# Patient Record
Sex: Female | Born: 1970 | Race: White | Hispanic: No | Marital: Single | Smoking: Never smoker
Health system: Southern US, Community
[De-identification: ages and names within clinical notes are randomized; demographics above are authoritative.]

## PROBLEM LIST (undated history)

## (undated) DIAGNOSIS — IMO0002 Reserved for concepts with insufficient information to code with codable children: Secondary | ICD-10-CM

## (undated) DIAGNOSIS — D169 Benign neoplasm of bone and articular cartilage, unspecified: Secondary | ICD-10-CM

## (undated) DIAGNOSIS — S83519A Sprain of anterior cruciate ligament of unspecified knee, initial encounter: Secondary | ICD-10-CM

## (undated) DIAGNOSIS — N946 Dysmenorrhea, unspecified: Secondary | ICD-10-CM

## (undated) HISTORY — PX: CYST EXCISION: SHX5701

## (undated) HISTORY — PX: KNEE CARTILAGE SURGERY: SHX688

## (undated) HISTORY — DX: Sprain of anterior cruciate ligament of unspecified knee, initial encounter: S83.519A

## (undated) HISTORY — DX: Reserved for concepts with insufficient information to code with codable children: IMO0002

## (undated) HISTORY — DX: Dysmenorrhea, unspecified: N94.6

---

## 2000-05-24 HISTORY — PX: BREAST REDUCTION SURGERY: SHX8

## 2001-05-24 HISTORY — PX: TUBAL LIGATION: SHX77

## 2008-10-15 ENCOUNTER — Encounter: Admission: RE | Admit: 2008-10-15 | Discharge: 2008-10-15 | Payer: Self-pay | Admitting: Obstetrics and Gynecology

## 2009-05-24 HISTORY — PX: ABDOMINAL HYSTERECTOMY: SHX81

## 2010-05-06 ENCOUNTER — Ambulatory Visit (HOSPITAL_COMMUNITY)
Admission: RE | Admit: 2010-05-06 | Discharge: 2010-05-07 | Payer: Self-pay | Source: Home / Self Care | Attending: Obstetrics and Gynecology | Admitting: Obstetrics and Gynecology

## 2010-05-06 ENCOUNTER — Encounter (INDEPENDENT_AMBULATORY_CARE_PROVIDER_SITE_OTHER): Payer: Self-pay | Admitting: Obstetrics and Gynecology

## 2010-06-14 ENCOUNTER — Encounter: Payer: Self-pay | Admitting: Surgery

## 2010-08-03 LAB — CBC
Hemoglobin: 11.2 g/dL — ABNORMAL LOW (ref 12.0–15.0)
MCHC: 32.8 g/dL (ref 30.0–36.0)
Platelets: 242 10*3/uL (ref 150–400)
RBC: 3.67 MIL/uL — ABNORMAL LOW (ref 3.87–5.11)
RDW: 13.5 % (ref 11.5–15.5)

## 2010-08-04 LAB — CBC
MCHC: 33.7 g/dL (ref 30.0–36.0)
Platelets: 235 10*3/uL (ref 150–400)
RDW: 13.4 % (ref 11.5–15.5)

## 2011-03-31 ENCOUNTER — Encounter (INDEPENDENT_AMBULATORY_CARE_PROVIDER_SITE_OTHER): Payer: Self-pay | Admitting: General Surgery

## 2011-04-02 ENCOUNTER — Encounter (INDEPENDENT_AMBULATORY_CARE_PROVIDER_SITE_OTHER): Payer: Self-pay | Admitting: Surgery

## 2011-04-02 ENCOUNTER — Ambulatory Visit (INDEPENDENT_AMBULATORY_CARE_PROVIDER_SITE_OTHER): Payer: 59 | Admitting: Surgery

## 2011-04-02 DIAGNOSIS — R220 Localized swelling, mass and lump, head: Secondary | ICD-10-CM

## 2011-04-02 NOTE — Progress Notes (Signed)
Subjective:     Patient ID: Madison Brady, female   DOB: Oct 15, 1970, 40 y.o.   MRN: [id number]  HPI The patient returns today. I saw her last year for a mass over her right  forehead. I recommended a CT scan of the head to evaluate this mass since it felt like bone and not like a sebaceous cyst. It is located over her right 4 head. It is getting larger. It is causing mild discomfort.   Review of Systems  Constitutional: Negative.   HENT: Negative.   Eyes: Negative.   Respiratory: Negative.   Cardiovascular: Negative.        Objective:   Physical Exam  Constitutional: She appears well-developed and well-nourished.  HENT:  Head: Atraumatic.  Nose: Nose normal.       3 cm mass fixed over right forehead. Minimally tender  Skin: Skin is warm and dry.  Psychiatric: She has a normal mood and affect. Her behavior is normal. Judgment and thought content normal.       Assessment:     Mass right forehead 3 cm enlarging.    Plan:     The patient needs a CT scan of her head to evaluate this mass since it feels fixed to her cranium and is not a sebaceous cyst. It is getting larger. Followup after CT done

## 2011-04-02 NOTE — Patient Instructions (Signed)
You will be scheduled for a CT of the head.

## 2011-04-08 ENCOUNTER — Ambulatory Visit
Admission: RE | Admit: 2011-04-08 | Discharge: 2011-04-08 | Disposition: A | Discharge status: Home / Self Care | Payer: 59 | Source: Ambulatory Visit | Attending: Surgery | Admitting: Surgery

## 2011-06-08 ENCOUNTER — Telehealth (INDEPENDENT_AMBULATORY_CARE_PROVIDER_SITE_OTHER): Payer: Self-pay | Admitting: Surgery

## 2011-06-11 ENCOUNTER — Telehealth (INDEPENDENT_AMBULATORY_CARE_PROVIDER_SITE_OTHER): Payer: Self-pay | Admitting: General Surgery

## 2011-06-11 NOTE — Telephone Encounter (Signed)
PT LEFT MESSAGE WITH FRONT DESK PERSONNEL RE RESULTS OF CT SCAN/ I REVIEWED CT RESULTS WITH PATIENT PER OK WITH DR. T. CORNETT/ DR. Luisa Hart ADVISED TO FAX COPY OF REPORT TO PT PRIMARY CARE DR./ MS Mccaughey INDICATED HER DR. IS DR. FULP WITH [facility]/ I WILL FAX COPY TO HER OFFICE/GY

## 2011-06-11 NOTE — Telephone Encounter (Signed)
PT NOTIFIED AND ADVISED OF CT RESULTS/GY

## 2011-07-13 ENCOUNTER — Other Ambulatory Visit: Payer: Self-pay | Admitting: Obstetrics and Gynecology

## 2011-07-13 DIAGNOSIS — N6320 Unspecified lump in the left breast, unspecified quadrant: Secondary | ICD-10-CM

## 2011-07-21 ENCOUNTER — Other Ambulatory Visit: Payer: 59

## 2011-07-28 ENCOUNTER — Other Ambulatory Visit: Payer: 59

## 2011-07-30 ENCOUNTER — Ambulatory Visit
Admission: RE | Admit: 2011-07-30 | Discharge: 2011-07-30 | Disposition: A | Discharge status: Home / Self Care | Payer: 59 | Source: Ambulatory Visit | Attending: Obstetrics and Gynecology | Admitting: Obstetrics and Gynecology

## 2011-07-30 DIAGNOSIS — N6320 Unspecified lump in the left breast, unspecified quadrant: Secondary | ICD-10-CM

## 2012-09-25 ENCOUNTER — Other Ambulatory Visit: Payer: Self-pay

## 2012-09-25 DIAGNOSIS — Z1231 Encounter for screening mammogram for malignant neoplasm of breast: Secondary | ICD-10-CM

## 2012-10-28 IMAGING — CT CT HEAD W/O CM
2 series · 16 of 30 positions shown, 18 images · non-contrast
Comparison: None.

CLINICAL DATA: Enlarging mass right frontal bone

CT HEAD WITHOUT CONTRAST
TECHNIQUE: Contiguous axial images were obtained from the base of
the skull through the vertex without contrast.

[Series 3: head bone · axial · 0.49mm/px · z∈[+53,+175]mm · 8 of 56 slices shown]
[im 6/56  bone]
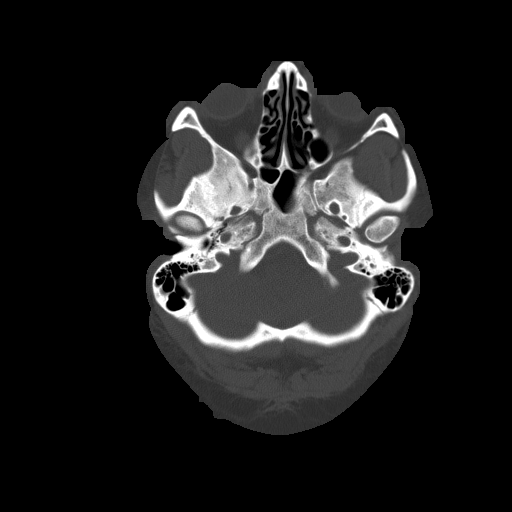
[im 12/56  bone]
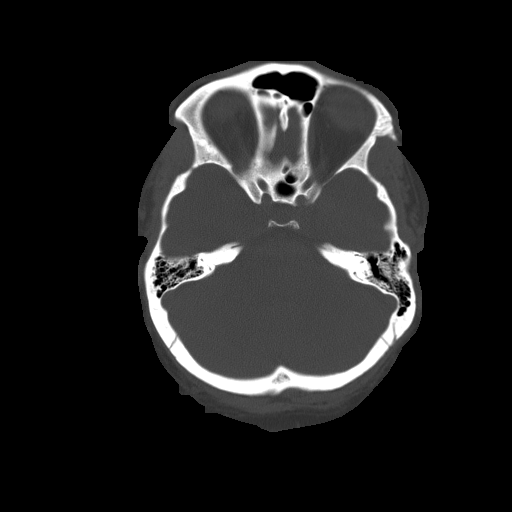
[im 18/56  bone]
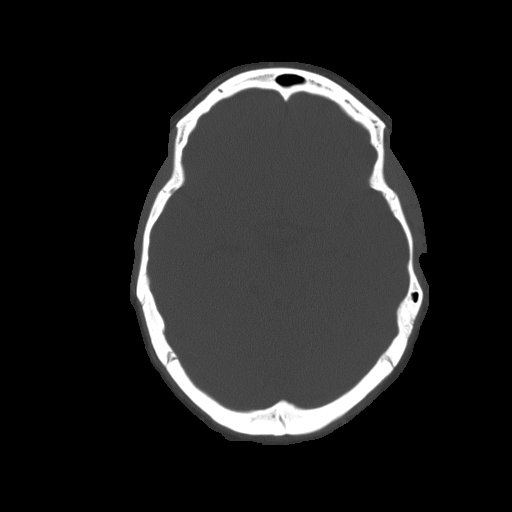
[im 24/56  bone]
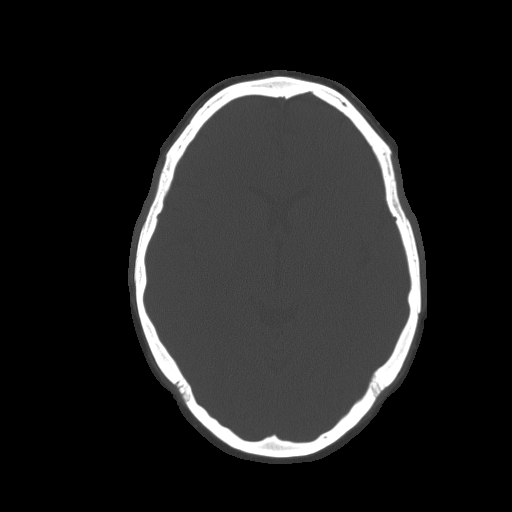
[im 32/56  bone]
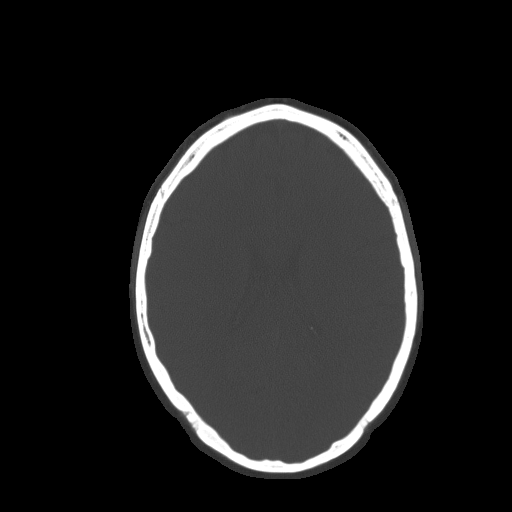
[im 38/56  bone]
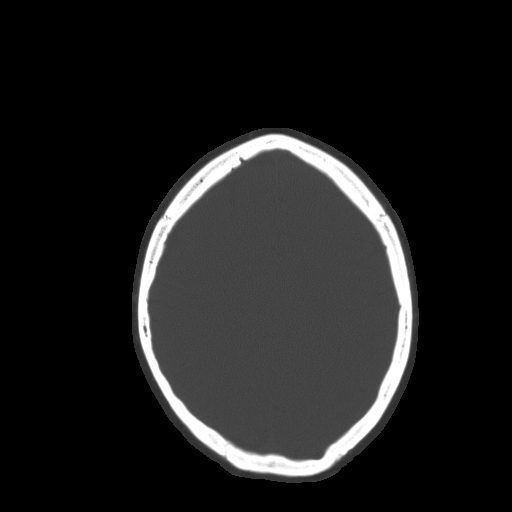
[im 44/56  bone]
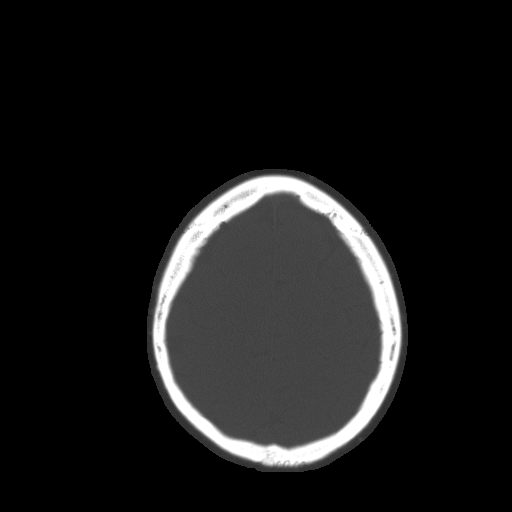
[im 50/56  bone]
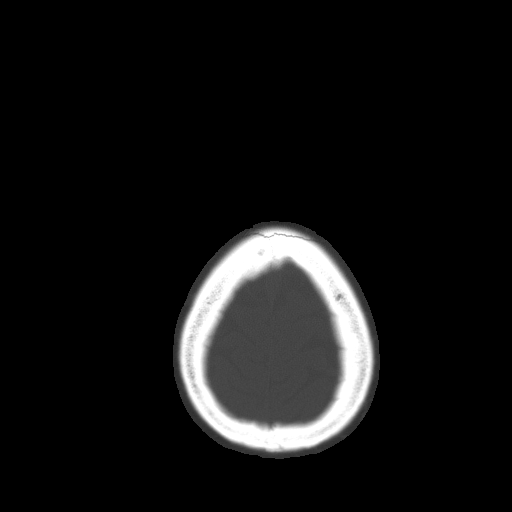

[Series 32: 3d filtered head w/o · axial · non-contrast · 0.49mm/px · z∈[+57,+174]mm · 8 of 28 slices shown, 10 images]
[im 4/28  brain]
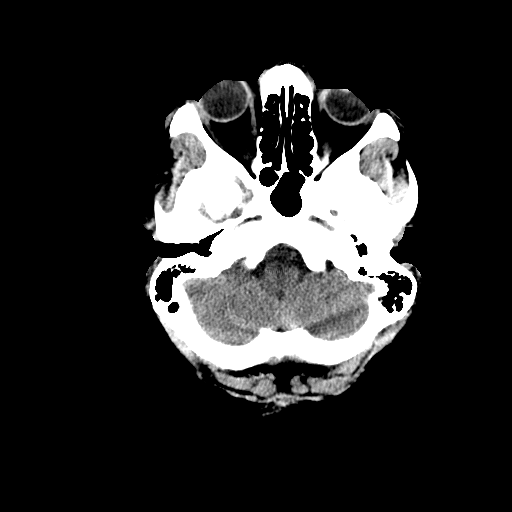
[im 4/28  bone]
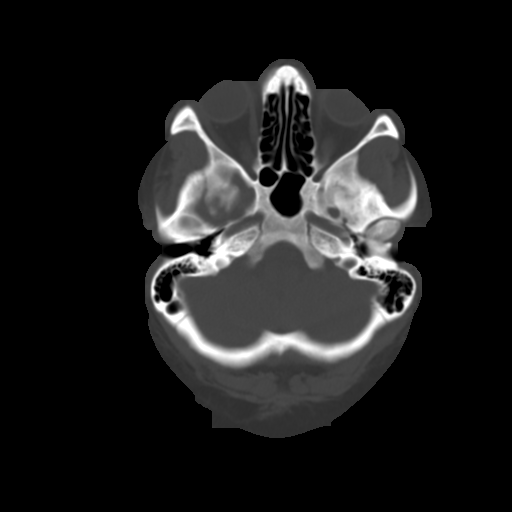
[im 7/28  brain]
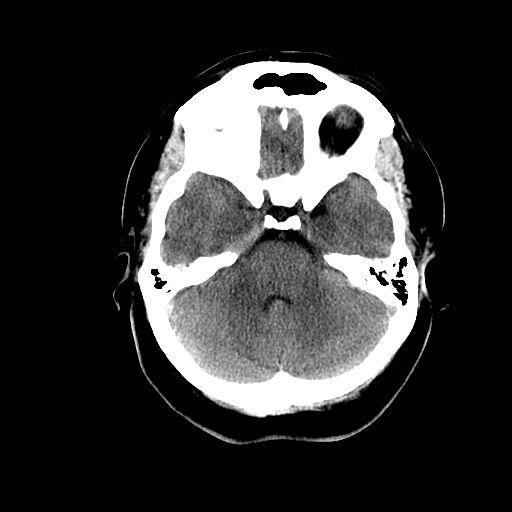
[im 10/28  brain]
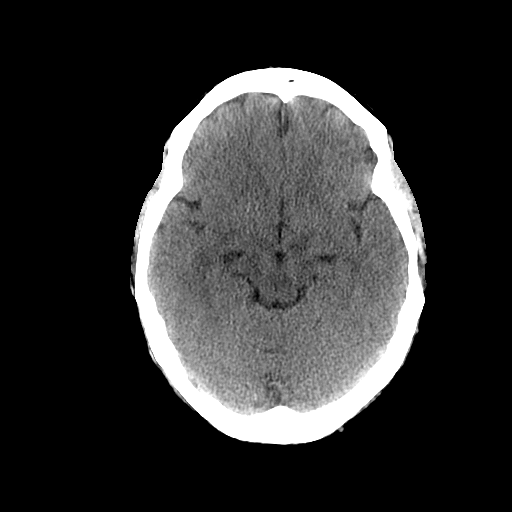
[im 13/28  brain]
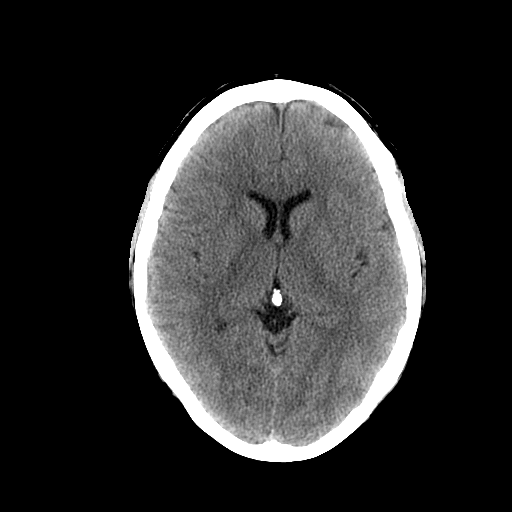
[im 16/28  brain]
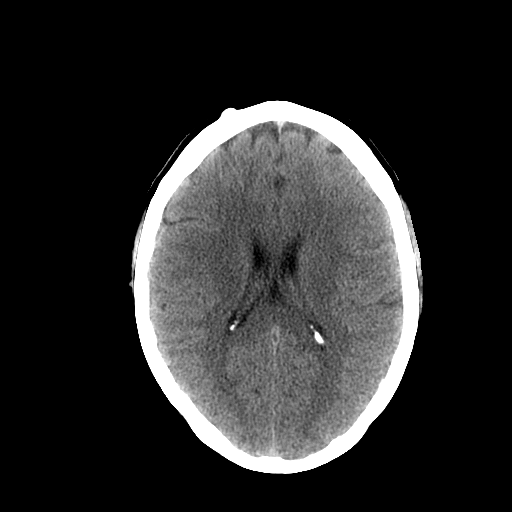
[im 16/28  bone]
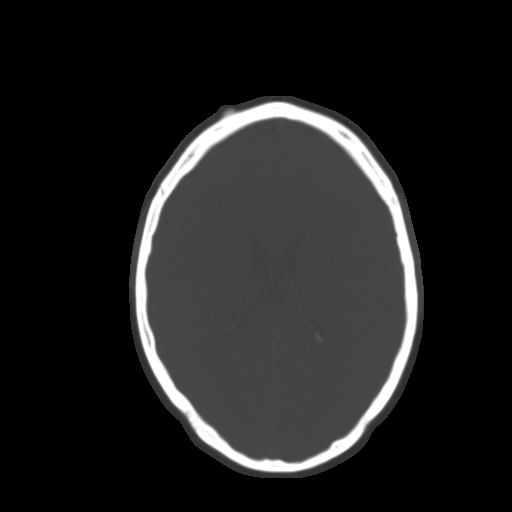
[im 19/28  brain]
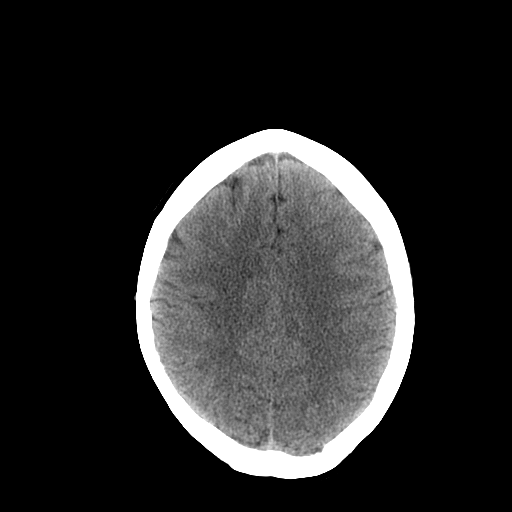
[im 22/28  brain]
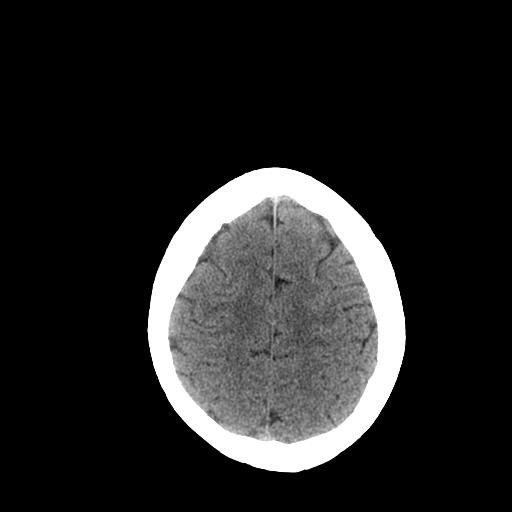
[im 25/28  brain]
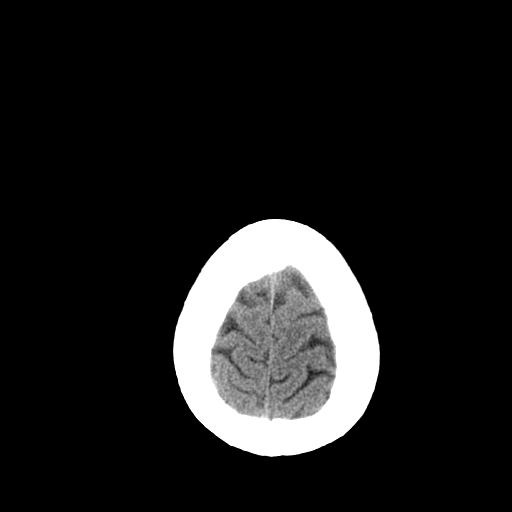

[16 of 30 positions shown; findings below may reference images not displayed]

FINDINGS: Palpable mass right frontal bone corresponds to a dense
calcification adjacent to the outer table of the right frontal
bone.  No associated soft tissue mass is identified.  This bony
density measures 9 x 4 mm.  No bony destruction or  abnormality of
the underlying frontal bone is identified.  No associated
intracranial abnormality.

Ventricle size is normal.  Negative for acute or chronic infarct.
Negative for hemorrhage.  No intracranial mass or edema.
IMPRESSION: No significant abnormality of the brain.

Right frontal bone lesion appears benign and may represent osteoma
or exostosis.

## 2012-10-31 ENCOUNTER — Ambulatory Visit: Payer: 59

## 2012-10-31 ENCOUNTER — Ambulatory Visit
Admission: RE | Admit: 2012-10-31 | Discharge: 2012-10-31 | Disposition: A | Discharge status: Home / Self Care | Payer: 59 | Source: Ambulatory Visit

## 2012-10-31 DIAGNOSIS — Z1231 Encounter for screening mammogram for malignant neoplasm of breast: Secondary | ICD-10-CM

## 2013-05-24 LAB — HM HEPATITIS C SCREENING LAB: HM Hepatitis Screen: POSITIVE

## 2013-11-08 ENCOUNTER — Other Ambulatory Visit: Payer: Self-pay

## 2013-11-08 DIAGNOSIS — Z1231 Encounter for screening mammogram for malignant neoplasm of breast: Secondary | ICD-10-CM

## 2013-11-12 ENCOUNTER — Encounter (INDEPENDENT_AMBULATORY_CARE_PROVIDER_SITE_OTHER): Payer: Self-pay

## 2013-11-12 ENCOUNTER — Other Ambulatory Visit: Payer: Self-pay | Admitting: Family Medicine

## 2013-11-12 ENCOUNTER — Ambulatory Visit
Admission: RE | Admit: 2013-11-12 | Discharge: 2013-11-12 | Disposition: A | Discharge status: Home / Self Care | Payer: 59 | Source: Ambulatory Visit

## 2013-11-12 DIAGNOSIS — Z1231 Encounter for screening mammogram for malignant neoplasm of breast: Secondary | ICD-10-CM

## 2013-11-12 DIAGNOSIS — N63 Unspecified lump in unspecified breast: Secondary | ICD-10-CM

## 2013-11-13 ENCOUNTER — Ambulatory Visit
Admission: RE | Admit: 2013-11-13 | Discharge: 2013-11-13 | Disposition: A | Discharge status: Home / Self Care | Payer: 59 | Source: Ambulatory Visit | Attending: Family Medicine | Admitting: Family Medicine

## 2013-11-13 DIAGNOSIS — N63 Unspecified lump in unspecified breast: Secondary | ICD-10-CM

## 2013-12-11 ENCOUNTER — Other Ambulatory Visit (HOSPITAL_COMMUNITY): Payer: Self-pay | Admitting: Family Medicine

## 2013-12-11 DIAGNOSIS — Z8249 Family history of ischemic heart disease and other diseases of the circulatory system: Secondary | ICD-10-CM

## 2013-12-19 ENCOUNTER — Ambulatory Visit (HOSPITAL_COMMUNITY)
Admission: RE | Admit: 2013-12-19 | Discharge: 2013-12-19 | Disposition: A | Discharge status: Home / Self Care | Payer: 59 | Source: Ambulatory Visit | Attending: Cardiovascular Disease | Admitting: Cardiovascular Disease

## 2013-12-19 DIAGNOSIS — Z8249 Family history of ischemic heart disease and other diseases of the circulatory system: Secondary | ICD-10-CM | POA: Diagnosis present

## 2013-12-19 NOTE — Progress Notes (Signed)
2D Echocardiogram Complete.  12/19/2013   Anajulia Leyendecker [place], RDCS

## 2013-12-25 ENCOUNTER — Ambulatory Visit (INDEPENDENT_AMBULATORY_CARE_PROVIDER_SITE_OTHER): Payer: 59 | Admitting: Internal Medicine

## 2013-12-25 ENCOUNTER — Encounter: Payer: Self-pay | Admitting: Internal Medicine

## 2013-12-25 ENCOUNTER — Ambulatory Visit: Payer: 59 | Admitting: Internal Medicine

## 2013-12-25 VITALS — BP 129/82 | HR 80 | Ht 61.0 in | Wt 201.0 lb

## 2013-12-25 DIAGNOSIS — Z8249 Family history of ischemic heart disease and other diseases of the circulatory system: Secondary | ICD-10-CM | POA: Insufficient documentation

## 2013-12-25 NOTE — Progress Notes (Signed)
Patient ID: Madison Brady, female   DOB: September 04, 1970, 44 y.o.   MRN: [id number]    OFFICE NOTE  Chief Complaint:  Family history of hypertrophic cardiomyopathy  Primary Care Physician: Antony Blackbird, MD  HPI:  Madison Brady is a 43 yo female who presents for evaluation following her brother being diagnosed with hypertrophic cardiomyopathy. Notes brother recently noted to have dyspnea of a visit to his PCP. They subsequently obtained an echo that revealed hypertrophic cardiomyopathy. The patient reports she does not remember any mention of genetic testing. The brother PCP advised that his siblings get tested. To her knowledge her other siblings have not been tested. Reports a significant history of cardiac disease in her younger brother and her father. At this time she denies chest pain, dyspnea, palpitations, syncope, orthopnea, PND. She notes she has not had any issues with palpitations in the past and did not notice the PVC that was revealed on her EKG.   PMHx:  Past Medical History  Diagnosis Date  . Dysmenorrhea   . Torn ACL   . Cyst     Past Surgical History  Procedure Laterality Date  . Abdominal hysterectomy  2011  . Tubal ligation  2003  . Breast reduction surgery  2002    FAMHx:  Family History  Problem Relation Age of Onset  . COPD Mother     and diabetes  . Breast cancer Maternal Grandmother   . Ovarian cancer Paternal Aunt   . Hypertrophic cardiomyopathy Brother   . Hypertension Father   . Heart disease Father     died of cardiac arrest  . Diabetes Father   . Lung cancer Father   . Diabetes Paternal Grandfather   . Heart attack Brother     SOCHx:   reports that she has never smoked. She has never used smokeless tobacco. She reports that she drinks alcohol. She reports that she does not use illicit drugs.  ALLERGIES:  Allergies  Allergen Reactions  . Codeine     ROS: Pertinent items are noted in HPI.  HOME MEDS: Current Outpatient  Prescriptions  Medication Sig Dispense Refill  . ibuprofen (ADVIL,MOTRIN) 200 MG tablet Take 200 mg by mouth every 6 (six) hours as needed.         No current facility-administered medications for this visit.    LABS/IMAGING: No results found for this or any previous visit (from the past 48 hour(s)). No results found.  VITALS: BP 129/82  Pulse 80  Ht 5\' 1"  (1.549 m)  Wt 201 lb (91.173 kg)  BMI 38.00 kg/m2  EXAM: General appearance: alert, cooperative and no distress Neck: no adenopathy, no carotid bruit and no JVD Lungs: clear to auscultation bilaterally Heart: regular rate and rhythm, S1, S2 normal, no murmur, click, rub or gallop Extremities: extremities normal, atraumatic, no cyanosis or edema Pulses: 2+ radial pulses  EKG: NSR, single PVC noted  Epworth sleepiness scale: score of 4  ASSESSMENT: 1. Family history of hypertrophic cardiomyopathy: brother recently diagnosed with this. Patient's recent echo was normal. Discussed no signs of hypertrophic cardiomyopathy in patient at this time. 2. PVC: single PVC noted on EKG today. Patient with normal structure and function on echo. Was asymptomatic with this. No history of palpitations. 3. Family history of heart disease: patient with multiple family members with heart disease placing her at increased risk for cardiac issues in the future. She is asymptomatic at this time. Screening for sleep apnea in not concerning for this issue.  PLAN: 1.   Discussed normal echo with patient. Advised to repeat echo in 10-15 years unless she develops symptoms. Follow-up prn for this issue. 2.   Single PVC in patient with no structural or functional is not worrisome at this time. Advised to monitor for symptoms and follow-up as needed. 3.   Patient with strong family history of heart disease. Need to focus on risk factor modification at this time. Would consider obtaining lipid panel to evaluate cholesterol status and would consider evaluation  of diabetic status. Additionally discussed needed for diet and exercise with patient as well. Patient to follow-up with PCP regarding this issue.    Tommi Rumps 12/25/2013, 4:09 PM  Pt. Seen and examined. Agree with the resident note as written.  Very pleasant 43 year old female kindly referred to me for evaluation of hypertrophic cardiomyopathy. Her brother was just diagnosed with hypertrophic cardiomyopathy based on echocardiogram. She underwent an echocardiogram which shows a structurally normal heart with normal wall thickness systolic and diastolic function and normal chamber sizes. This is very reassuring and meatus very unlikely for her to have any significant hypertrophic cardiomyopathy. She did have a single PVC today however with normal function and being asymptomatic I am not concerned about that. She may wish to watch her caffeine intake.  Given her family history of coronary artery disease, would focus on risk factor modification including checking a lipid profile with a goal LDL cholesterol less than 100. I would also recommend exercise and weight loss as well.  She should ideally have another echocardiogram in about 10-15 years to make sure there is no significant hypertrophic. I don't feel that she needs to see a cardiologist unless it is abnormal.  Thank you for the kind referral.  Pixie Casino, MD, [facility] Attending Cardiologist [place]

## 2013-12-25 NOTE — Patient Instructions (Signed)
Your physician recommends that you schedule a follow-up appointment as needed  

## 2014-01-14 ENCOUNTER — Other Ambulatory Visit: Payer: Self-pay | Admitting: Otolaryngology

## 2014-11-11 ENCOUNTER — Other Ambulatory Visit: Payer: Self-pay

## 2014-11-11 DIAGNOSIS — Z1231 Encounter for screening mammogram for malignant neoplasm of breast: Secondary | ICD-10-CM

## 2014-12-16 ENCOUNTER — Ambulatory Visit
Admission: RE | Admit: 2014-12-16 | Discharge: 2014-12-16 | Disposition: A | Discharge status: Home / Self Care | Payer: 59 | Source: Ambulatory Visit

## 2014-12-16 DIAGNOSIS — Z1231 Encounter for screening mammogram for malignant neoplasm of breast: Secondary | ICD-10-CM

## 2015-05-01 ENCOUNTER — Other Ambulatory Visit: Payer: Self-pay | Admitting: Obstetrics and Gynecology

## 2015-05-01 DIAGNOSIS — R1032 Left lower quadrant pain: Secondary | ICD-10-CM

## 2015-05-05 ENCOUNTER — Ambulatory Visit
Admission: RE | Admit: 2015-05-05 | Discharge: 2015-05-05 | Disposition: A | Discharge status: Home / Self Care | Payer: 59 | Source: Ambulatory Visit | Attending: Obstetrics and Gynecology | Admitting: Obstetrics and Gynecology

## 2015-05-05 DIAGNOSIS — R1032 Left lower quadrant pain: Secondary | ICD-10-CM

## 2015-05-05 MED ORDER — IOPAMIDOL (ISOVUE-300) INJECTION 61%
100.0000 mL | Freq: Once | INTRAVENOUS | Status: AC | PRN
  Administered 2015-05-05: 100 mL via INTRAVENOUS

## 2018-03-28 ENCOUNTER — Emergency Department (HOSPITAL_COMMUNITY): Payer: No Typology Code available for payment source

## 2018-03-28 ENCOUNTER — Other Ambulatory Visit: Payer: Self-pay

## 2018-03-28 ENCOUNTER — Emergency Department (HOSPITAL_COMMUNITY)
Admission: EM | Admit: 2018-03-28 | Discharge: 2018-03-28 | Disposition: A | Discharge status: Home / Self Care | Payer: No Typology Code available for payment source | Attending: Emergency Medicine | Admitting: Emergency Medicine

## 2018-03-28 ENCOUNTER — Encounter (HOSPITAL_COMMUNITY): Payer: Self-pay | Admitting: Emergency Medicine

## 2018-03-28 DIAGNOSIS — Y999 Unspecified external cause status: Secondary | ICD-10-CM | POA: Insufficient documentation

## 2018-03-28 DIAGNOSIS — M25561 Pain in right knee: Secondary | ICD-10-CM | POA: Diagnosis not present

## 2018-03-28 DIAGNOSIS — Y92410 Unspecified street and highway as the place of occurrence of the external cause: Secondary | ICD-10-CM | POA: Insufficient documentation

## 2018-03-28 DIAGNOSIS — M25521 Pain in right elbow: Secondary | ICD-10-CM | POA: Diagnosis not present

## 2018-03-28 DIAGNOSIS — S161XXA Strain of muscle, fascia and tendon at neck level, initial encounter: Secondary | ICD-10-CM

## 2018-03-28 DIAGNOSIS — M25511 Pain in right shoulder: Secondary | ICD-10-CM | POA: Diagnosis not present

## 2018-03-28 DIAGNOSIS — M791 Myalgia, unspecified site: Secondary | ICD-10-CM | POA: Diagnosis not present

## 2018-03-28 DIAGNOSIS — Y9389 Activity, other specified: Secondary | ICD-10-CM | POA: Diagnosis not present

## 2018-03-28 DIAGNOSIS — S199XXA Unspecified injury of neck, initial encounter: Secondary | ICD-10-CM | POA: Diagnosis present

## 2018-03-28 DIAGNOSIS — M549 Dorsalgia, unspecified: Secondary | ICD-10-CM | POA: Diagnosis not present

## 2018-03-28 HISTORY — DX: Benign neoplasm of bone and articular cartilage, unspecified: D16.9

## 2018-03-28 MED ORDER — CYCLOBENZAPRINE HCL 10 MG PO TABS: 10.0000 mg | ORAL_TABLET | Freq: Two times a day (BID) | ORAL | 0 refills | Status: DC | PRN

## 2018-03-28 NOTE — Discharge Instructions (Addendum)
Take Motrin and Tylenol for pain as needed as directed. Take Flexeril as needed as prescribed for muscle spasm. Warm compresses to sore muscles for 20 minutes at a time. Follow-up with your orthopedist tomorrow as previously planned. Recheck with your primary care provider.
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
## 2018-03-28 NOTE — ED Notes (Signed)
Patient transported to X-ray 
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
## 2020-01-18 LAB — HM PAP SMEAR: HM Pap smear: NORMAL

## 2020-06-11 ENCOUNTER — Other Ambulatory Visit: Payer: Self-pay

## 2020-06-11 ENCOUNTER — Emergency Department (HOSPITAL_COMMUNITY): Payer: Self-pay

## 2020-06-11 ENCOUNTER — Emergency Department (HOSPITAL_COMMUNITY)
Admission: EM | Admit: 2020-06-11 | Discharge: 2020-06-11 | Disposition: A | Discharge status: Home / Self Care | Payer: Self-pay | Attending: Emergency Medicine | Admitting: Emergency Medicine

## 2020-06-11 ENCOUNTER — Encounter (HOSPITAL_COMMUNITY): Payer: Self-pay | Admitting: Emergency Medicine

## 2020-06-11 DIAGNOSIS — R52 Pain, unspecified: Secondary | ICD-10-CM

## 2020-06-11 DIAGNOSIS — W540XXA Bitten by dog, initial encounter: Secondary | ICD-10-CM | POA: Insufficient documentation

## 2020-06-11 DIAGNOSIS — Z23 Encounter for immunization: Secondary | ICD-10-CM | POA: Insufficient documentation

## 2020-06-11 DIAGNOSIS — S61239A Puncture wound without foreign body of unspecified finger without damage to nail, initial encounter: Secondary | ICD-10-CM

## 2020-06-11 DIAGNOSIS — S61152A Open bite of left thumb with damage to nail, initial encounter: Secondary | ICD-10-CM | POA: Insufficient documentation

## 2020-06-11 MED ORDER — ACETAMINOPHEN 325 MG PO TABS
650.0000 mg | ORAL_TABLET | Freq: Once | ORAL | Status: AC
  Administered 2020-06-11: 650 mg via ORAL
  Filled 2020-06-11: qty 2

## 2020-06-11 MED ORDER — TETANUS-DIPHTH-ACELL PERTUSSIS 5-2.5-18.5 LF-MCG/0.5 IM SUSY
0.5000 mL | PREFILLED_SYRINGE | Freq: Once | INTRAMUSCULAR | Status: AC
  Administered 2020-06-11: 0.5 mL via INTRAMUSCULAR
  Filled 2020-06-11: qty 0.5

## 2020-06-11 MED ORDER — AMOXICILLIN-POT CLAVULANATE 875-125 MG PO TABS: 1.0000 | ORAL_TABLET | Freq: Two times a day (BID) | ORAL | 0 refills | Status: DC
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
## 2020-06-11 NOTE — ED Provider Notes (Signed)
[facility] DEPT Provider Note   CSN: [id number] Arrival date & time: 06/11/20  1445     History No chief complaint on file.   Madison Brady is a 50 y.o. female.  HPI   50 year old female with a history of cyst, dysmenorrhea, osteoma, torn ACL, who presents the emergency department today for evaluation after a dog bite.  States that she just adopted a rescue about 1 month ago and this still got into a fight with her other dogs.  She tried to separate them and in the process she was bit by the dog.  States it was a large dog and weighs about 100 pounds but she is not sure of the breed.  She is not sure if it has been vaccinated she does not have these records.  The dog has been taken into custody by animal control.  She denies any other injuries.  Denies any numbness.  Past Medical History:  Diagnosis Date  . Cyst   . Dysmenorrhea   . Osteoma   . Torn ACL     Patient Active Problem List   Diagnosis Date Noted  . Family history of hypertrophic cardiomyopathy 12/25/2013    Past Surgical History:  Procedure Laterality Date  . ABDOMINAL HYSTERECTOMY  2011  . BREAST REDUCTION SURGERY  2002  . TUBAL LIGATION  2003     OB History   No obstetric history on file.     Family History  Problem Relation Age of Onset  . COPD Mother        and diabetes  . Breast cancer Maternal Grandmother   . Hypertension Father   . Heart disease Father        died of cardiac arrest  . Diabetes Father   . Lung cancer Father   . Heart attack Brother   . Ovarian cancer Paternal Aunt   . Hypertrophic cardiomyopathy Brother   . Diabetes Paternal Grandfather     Social History   Tobacco Use  . Smoking status: Never Smoker  . Smokeless tobacco: Never Used  Substance Use Topics  . Alcohol use: Yes    Comment: occasional wine  . Drug use: No    Home Medications Prior to Admission medications   Medication Sig Start Date End Date Taking? Authorizing  Provider  amoxicillin-clavulanate (AUGMENTIN) 875-125 MG tablet Take 1 tablet by mouth every 12 (twelve) hours. 06/11/20  Yes Fatime Biswell S, PA-C  Calcium Carbonate-Vit D-Min (CALCIUM 1200 PO) Take 1,200 mg by mouth daily.   Yes [provider]  ibuprofen (ADVIL,MOTRIN) 200 MG tablet Take 600-800 mg by mouth every 6 (six) hours as needed for headache (for headaches or pain).   Yes [provider]  vitamin B-12 (CYANOCOBALAMIN) 100 MCG tablet Take 100 mcg by mouth daily.   Yes [provider]  cyclobenzaprine (FLEXERIL) 10 MG tablet Take 1 tablet (10 mg total) by mouth 2 (two) times daily as needed for muscle spasms. Patient not taking: No sig reported 03/28/18   Suella Broad A, PA-C    Allergies    Codeine, Sulfa antibiotics, Diphenhydramine-zinc acetate, Doxycycline, and Ranitidine hcl  Review of Systems   Review of Systems  Constitutional: Negative for fever.  Musculoskeletal:       Finger pain  Skin: Positive for wound.  Neurological: Negative for weakness and numbness.    Physical Exam Updated Vital Signs BP (!) 149/97 (BP Location: Right Arm)   Pulse 99   Temp 98.3 F (36.8  C) (Oral)   Resp 16   Ht 5\' 1"  (1.549 m)   Wt 93 kg   SpO2 99%   BMI 38.74 kg/m   Physical Exam Vitals and nursing note reviewed.  Constitutional:      General: She is not in acute distress.    Appearance: She is well-developed and well-nourished.  HENT:     Head: Normocephalic and atraumatic.  Eyes:     Conjunctiva/sclera: Conjunctivae normal.  Cardiovascular:     Rate and Rhythm: Normal rate.  Pulmonary:     Effort: Pulmonary effort is normal.  Musculoskeletal:        General: Normal range of motion.     Cervical back: Neck supple.     Comments: Puncture wound noted to the distal left thumb on the dorsal surface. The nail is partially involved as well but still well attached. NVI. Normal ROM. See picture below.   Skin:    General: Skin is warm and dry.   Neurological:     Mental Status: She is alert.  Psychiatric:        Mood and Affect: Mood and affect normal.           ED Results / Procedures / Treatments   Labs (all labs ordered are listed, but only abnormal results are displayed) Labs Reviewed - No data to display  EKG None  Radiology DG Finger Thumb Left  Result Date: 06/11/2020 CLINICAL DATA:  Dog bite.  Broken nail. EXAM: LEFT THUMB 2+V COMPARISON:  None FINDINGS: Mildly displaced fracture involving tip of the thumb distal phalanx. This fracture may be minimally comminuted. Soft tissue abnormality in this area compatible with injury to the fingernail. Small density in soft tissues near the base of the thumb is indeterminate. IMPRESSION: Mildly displaced fracture involving the thumb distal phalanx tip with adjacent soft tissue injury. Small indeterminate density in the soft tissues near the base of the thumb. Electronically Signed   By: Markus Daft M.D.   On: 06/11/2020 15:44    Procedures Procedures (including critical care time)  Medications Ordered in ED Medications  Tdap (BOOSTRIX) injection 0.5 mL (0.5 mLs Intramuscular Given 06/11/20 2201)  acetaminophen (TYLENOL) tablet 650 mg (650 mg Oral Given 06/11/20 2219)    ED Course  I have reviewed the triage vital signs and the nursing notes.  Pertinent labs & imaging results that were available during my care of the patient were reviewed by me and considered in my medical decision making (see chart for details).    MDM Rules/Calculators/A&P                         50 y/o F presenting for eval of dog bite.   Xray left thumb - Mildly displaced fracture involving the thumb distal phalanx tip with adjacent soft tissue injury. Small indeterminate density in the soft tissues near the base of the thumb.  10:14 PM CONSULT With Dr. Jeannie Fend with hand surgery. He is in agreement with plan to keep nail intact, irrigate, splint, give abx and f/u in office.   Pt wounds  irrigated well with with sterile saline.  Wounds examined with visualization of the base and no foreign bodies seen.  Pt Alert and oriented, NAD, nontoxic, nonseptic appearing.  Capillary refill intact and pt without neurologic deficit.  Patient tetanus updated.  Patient rabies vaccine and immunoglobulin risk and benefit discussed.  Pt consents. Pain treated in the emergency department. Wounds not closed secondary to  concern for infection. Finger was placed in splint. We'll discharge home with pain medication, Augmentin and requests for close follow-up with hand surgery or back in the ER.    Final Clinical Impression(s) / ED Diagnoses Final diagnoses:  Dog bite, initial encounter  Puncture wound of finger, initial encounter    Rx / DC Orders ED Discharge Orders         Ordered    amoxicillin-clavulanate (AUGMENTIN) 875-125 MG tablet  Every 12 hours        06/11/20 2228           Rodney Booze, PA-C 06/11/20 2231    Fredia Sorrow, MD 06/16/20 [phone]

## 2020-06-11 NOTE — Discharge Instructions (Signed)
You were given a prescription for antibiotics. Please take the antibiotic prescription fully.   You will need to follow-up with animal control in regards to the rabies status of her dog.  If the dog is having symptoms concerning for rabies and you will need to return for your rabies vaccinations.  You are given a referral to a hand doctor, Dr. Jeannie Fend.  Please call the office to schedule an appointment for follow-up.  Please return to the emergency department for any new or worsening symptoms in the meantime.

## 2022-01-17 LAB — HM MAMMOGRAPHY

## 2022-02-02 ENCOUNTER — Telehealth: Payer: Self-pay | Admitting: Gastroenterology

## 2022-02-02 NOTE — Telephone Encounter (Signed)
Good Afternoon Dr Silverio Decamp,  We have received a request from patient to be seen with you for a colonoscopy procedure. Patient has never had the procedure done before .  Patient has previous history with [facility]. Patient was seen in 2018 for and ov. Records are available for review in [facility]. Please review and advise on scheduling.  Thank you

## 2022-02-03 NOTE — Telephone Encounter (Signed)
Ok to schedule direct colonoscopy if no GI symptoms

## 2022-02-22 ENCOUNTER — Encounter: Payer: Self-pay | Admitting: Gastroenterology

## 2022-03-08 ENCOUNTER — Ambulatory Visit (AMBULATORY_SURGERY_CENTER): Payer: Self-pay

## 2022-03-08 VITALS — Ht 61.0 in | Wt 209.0 lb

## 2022-03-08 DIAGNOSIS — T7840XA Allergy, unspecified, initial encounter: Secondary | ICD-10-CM

## 2022-03-08 DIAGNOSIS — Z1211 Encounter for screening for malignant neoplasm of colon: Secondary | ICD-10-CM

## 2022-03-08 HISTORY — DX: Allergy, unspecified, initial encounter: T78.40XA

## 2022-03-08 MED ORDER — NA SULFATE-K SULFATE-MG SULF 17.5-3.13-1.6 GM/177ML PO SOLN: 1.0000 | Freq: Once | ORAL | 0 refills | Status: AC

## 2022-03-08 NOTE — Progress Notes (Signed)
No egg or soy allergy known to patient  No issues known to pt with past sedation with any surgeries or procedures Patient denies ever being told they had issues or difficulty with intubation  No FH of Malignant Hyperthermia Pt is not on diet pills Pt is not on  home 02  Pt is not on blood thinners  Pt denies issues with constipation  No A fib or A flutter Have any cardiac testing pending--no Pt instructed to use Singlecare[employer][email or web address] or GoodRx for a price reduction on prep   

## 2022-03-23 ENCOUNTER — Encounter: Payer: Self-pay | Admitting: Gastroenterology

## 2022-03-30 ENCOUNTER — Encounter: Payer: Self-pay | Admitting: Gastroenterology

## 2022-03-30 ENCOUNTER — Ambulatory Visit (AMBULATORY_SURGERY_CENTER): Payer: No Typology Code available for payment source | Admitting: Gastroenterology

## 2022-03-30 VITALS — BP 126/79 | HR 78 | Temp 97.7°F | Resp 17 | Ht 61.0 in | Wt 209.0 lb

## 2022-03-30 DIAGNOSIS — Z1211 Encounter for screening for malignant neoplasm of colon: Secondary | ICD-10-CM | POA: Diagnosis present

## 2022-03-30 MED ORDER — SODIUM CHLORIDE 0.9 % IV SOLN: 500.0000 mL | INTRAVENOUS | Status: DC

## 2022-03-30 NOTE — Progress Notes (Signed)
[facility] History and Physical   Primary Care Physician:  Fanny Bien, MD   Reason for Procedure:  Colorectal cancer screening  Plan:    Screening colonoscopy with possible interventions as needed     HPI: Madison Brady is a very pleasant 51 y.o. female here for screening colonoscopy. Denies any nausea, vomiting, abdominal pain, melena or bright red blood per rectum  The risks and benefits as well as alternatives of endoscopic procedure(s) have been discussed and reviewed. All questions answered. The patient agrees to proceed.    Past Medical History:  Diagnosis Date   Allergy 03/08/2022   sinusitis with seasonal changes   Cyst    Dysmenorrhea    Osteoma    Torn ACL     Past Surgical History:  Procedure Laterality Date   ABDOMINAL HYSTERECTOMY  05/24/2009   BREAST REDUCTION SURGERY  05/24/2000   CYST EXCISION Right    KNEE CARTILAGE SURGERY Right    TUBAL LIGATION  05/24/2001    Prior to Admission medications   Medication Sig Start Date End Date Taking? Authorizing Provider  ibuprofen (ADVIL,MOTRIN) 200 MG tablet Take 600-800 mg by mouth every 6 (six) hours as needed for headache (for headaches or pain).    [provider]  vitamin B-12 (CYANOCOBALAMIN) 100 MCG tablet Take 100 mcg by mouth daily. Patient not taking: Reported on 03/08/2022    [provider]    Current Outpatient Medications  Medication Sig Dispense Refill   ibuprofen (ADVIL,MOTRIN) 200 MG tablet Take 600-800 mg by mouth every 6 (six) hours as needed for headache (for headaches or pain).     vitamin B-12 (CYANOCOBALAMIN) 100 MCG tablet Take 100 mcg by mouth daily. (Patient not taking: Reported on 03/08/2022)     Current Facility-Administered Medications  Medication Dose Route Frequency Provider Last Rate Last Admin   0.9 %  sodium chloride infusion  500 mL Intravenous Continuous Delorice Bannister, Venia Minks, MD        Allergies as of 03/30/2022 - Review Complete  03/30/2022  Allergen Reaction Noted   Codeine Itching and Other (See Comments) 04/02/2011   Sulfa antibiotics Hives, Itching, and Other (See Comments) 03/28/2018   Diphenhydramine-zinc acetate Itching and Rash 08/19/2019   Doxycycline Itching and Rash 08/19/2019   Ranitidine hcl Rash 10/04/2018    Family History  Problem Relation Age of Onset   COPD Mother        and diabetes   Hypertension Father    Heart disease Father        died of cardiac arrest   Diabetes Father    Lung cancer Father    Heart attack Brother    Hypertrophic cardiomyopathy Brother    Ovarian cancer Paternal Aunt    Breast cancer Maternal Grandmother    Diabetes Paternal Grandfather    Colon cancer Neg Hx    Colon polyps Neg Hx    Esophageal cancer Neg Hx    Stomach cancer Neg Hx    Rectal cancer Neg Hx     Social History   Socioeconomic History   Marital status: Single    Spouse name: Not on file   Number of children: Not on file   Years of education: Not on file   Highest education level: Not on file  Occupational History   Not on file  Tobacco Use   Smoking status: Never   Smokeless tobacco: Never  Vaping Use   Vaping Use: Never used  Substance and Sexual Activity  Alcohol use: Yes    Comment: occasional wine, once monthly one glass   Drug use: Never   Sexual activity: Not on file  Other Topics Concern   Not on file  Social History Narrative   Not on file   Social Determinants of Health   Financial Resource Strain: Not on file  Food Insecurity: Not on file  Transportation Needs: Not on file  Physical Activity: Not on file  Stress: Not on file  Social Connections: Not on file  Intimate Partner Violence: Not on file    Review of Systems:  All other review of systems negative except as mentioned in the HPI.  Physical Exam: Vital signs in last 24 hours: Blood Pressure 126/71   Pulse 82   Temperature 97.7 F (36.5 C)   Height '5\' 1"'$  (1.549 m)   Weight 209 lb (94.8 kg)    Oxygen Saturation 97%   Body Mass Index 39.49 kg/m  General:   Alert, NAD Lungs:  Clear .   Heart:  Regular rate and rhythm Abdomen:  Soft, nontender and nondistended. Neuro/Psych:  Alert and cooperative. Normal mood and affect. A and O x 3  Reviewed labs, radiology imaging, old records and pertinent past GI work up  Patient is appropriate for planned procedure(s) and anesthesia in an ambulatory setting   K. Denzil Magnuson , MD [phone]

## 2022-03-30 NOTE — Op Note (Signed)
[facility] Patient Name: Madison Brady Procedure Date: 03/30/2022 10:44 AM MRN: [id number] Endoscopist: Mauri Pole , MD, [id number] Age: 51 Referring MD:  Date of Birth: [birth year] Gender: Female Account #: [id number] Procedure:                Colonoscopy Indications:              Screening for colorectal malignant neoplasm Medicines:                Monitored Anesthesia Care Procedure:                Pre-Anesthesia Assessment:                           - Prior to the procedure, a History and Physical                            was performed, and patient medications and                            allergies were reviewed. The patient's tolerance of                            previous anesthesia was also reviewed. The risks                            and benefits of the procedure and the sedation                            options and risks were discussed with the patient.                            All questions were answered, and informed consent                            was obtained. Prior Anticoagulants: The patient has                            taken no anticoagulant or antiplatelet agents. ASA                            Grade Assessment: II - A patient with mild systemic                            disease. After reviewing the risks and benefits,                            the patient was deemed in satisfactory condition to                            undergo the procedure.                           After obtaining informed consent, the colonoscope  was passed under direct vision. Throughout the                            procedure, the patient's blood pressure, pulse, and                            oxygen saturations were monitored continuously. The                            Olympus PCF-H190DL (UK#[id number]) Colonoscope was                            introduced through the anus and advanced to the the                             cecum, identified by appendiceal orifice and                            ileocecal valve. The colonoscopy was performed                            without difficulty. The patient tolerated the                            procedure well. The quality of the bowel                            preparation was good. The ileocecal valve,                            appendiceal orifice, and rectum were photographed. Scope In: 10:51:12 AM Scope Out: 11:05:10 AM Scope Withdrawal Time: 0 hours 7 minutes 37 seconds  Total Procedure Duration: 0 hours 13 minutes 58 seconds  Findings:                 The perianal and digital rectal examinations were                            normal.                           Scattered small-mouthed diverticula were found in                            the sigmoid colon and descending colon.                           Non-bleeding external and internal hemorrhoids were                            found during retroflexion. The hemorrhoids were                            small. Complications:            No immediate complications. Estimated Blood Loss:  Estimated blood loss was minimal. Impression:               - Diverticulosis in the sigmoid colon and in the                            descending colon.                           - Non-bleeding external and internal hemorrhoids.                           - No specimens collected. Recommendation:           - Patient has a contact number available for                            emergencies. The signs and symptoms of potential                            delayed complications were discussed with the                            patient. Return to normal activities tomorrow.                            Written discharge instructions were provided to the                            patient.                           - Resume previous diet.                           - Continue present medications.                           - Repeat  colonoscopy in 10 years for surveillance. Mauri Pole, MD 03/30/2022 11:12:56 AM This report has been signed electronically.

## 2022-03-30 NOTE — Progress Notes (Signed)
Pt's states no medical or surgical changes since previsit or office visit. 

## 2022-03-30 NOTE — Progress Notes (Signed)
Vss nad trans to pacu °

## 2022-03-30 NOTE — Patient Instructions (Signed)
HANDOUTS PROVIDED ON: DIVERTICULOSIS & HEMORRHOIDS  Your next colonoscopy should occur in 10 years.    You may resume your previous diet and medication schedule.  Thank you for allowing [place] to care for you today!!!   YOU HAD AN ENDOSCOPIC PROCEDURE TODAY AT [facility]:   Refer to the procedure report that was given to you for any specific questions about what was found during the examination.  If the procedure report does not answer your questions, please call your gastroenterologist to clarify.  If you requested that your care partner not be given the details of your procedure findings, then the procedure report has been included in a sealed envelope for you to review at your convenience later.  YOU SHOULD EXPECT: Some feelings of bloating in the abdomen. Passage of more gas than usual.  Walking can help get rid of the air that was put into your GI tract during the procedure and reduce the bloating. If you had a lower endoscopy (such as a colonoscopy or flexible sigmoidoscopy) you may notice spotting of blood in your stool or on the toilet paper. If you underwent a bowel prep for your procedure, you may not have a normal bowel movement for a few days.  Please Note:  You might notice some irritation and congestion in your nose or some drainage.  This is from the oxygen used during your procedure.  There is no need for concern and it should clear up in a day or so.  SYMPTOMS TO REPORT IMMEDIATELY:  Following lower endoscopy (colonoscopy or flexible sigmoidoscopy):  Excessive amounts of blood in the stool  Significant tenderness or worsening of abdominal pains  Swelling of the abdomen that is new, acute  Fever of 100F or higher  For urgent or emergent issues, a gastroenterologist can be reached at any hour by calling [phone]. Do not use MyChart messaging for urgent concerns.    DIET:  We do recommend a small meal at first, but then you may proceed to your regular  diet.  Drink plenty of fluids but you should avoid alcoholic beverages for 24 hours.  ACTIVITY:  You should plan to take it easy for the rest of today and you should NOT DRIVE or use heavy machinery until tomorrow (because of the sedation medicines used during the test).    FOLLOW UP: Our staff will call the number listed on your records the next business day following your procedure.  We will call around 7:15- 8:00 am to check on you and address any questions or concerns that you may have regarding the information given to you following your procedure. If we do not reach you, we will leave a message.     If any biopsies were taken you will be contacted by phone or by letter within the next 1-3 weeks.  Please call us at [phone] if you have not heard about the biopsies in 3 weeks.    SIGNATURES/CONFIDENTIALITY: You and/or your care partner have signed paperwork which will be entered into your electronic medical record.  These signatures attest to the fact that that the information above on your After Visit Summary has been reviewed and is understood.  Full responsibility of the confidentiality of this discharge information lies with you and/or your care-partner.

## 2022-03-31 ENCOUNTER — Telehealth: Payer: Self-pay | Admitting: *Deleted

## 2022-03-31 NOTE — Telephone Encounter (Signed)
  Follow up Call-     03/30/2022    9:44 AM  Call back number  Post procedure Call Back phone  # [phone]  Permission to leave phone message Yes     Patient questions:  Do you have a fever, pain , or abdominal swelling? No. Pain Score  0 *  Have you tolerated food without any problems? Yes.    Have you been able to return to your normal activities? Yes.    Do you have any questions about your discharge instructions: Diet   No. Medications  No. Follow up visit  No.  Do you have questions or concerns about your Care? No.  Actions: * If pain score is 4 or above: No action needed, pain <4.

## 2022-09-23 ENCOUNTER — Other Ambulatory Visit: Payer: Self-pay | Admitting: Family Medicine

## 2022-09-23 DIAGNOSIS — M898X1 Other specified disorders of bone, shoulder: Secondary | ICD-10-CM

## 2022-10-20 ENCOUNTER — Encounter: Payer: Self-pay | Admitting: Family Medicine

## 2022-10-20 ENCOUNTER — Ambulatory Visit: Payer: No Typology Code available for payment source | Admitting: Family Medicine

## 2022-10-20 VITALS — BP 115/77 | HR 78 | Temp 98.2°F | Ht 61.0 in | Wt 214.6 lb

## 2022-10-20 DIAGNOSIS — Z1211 Encounter for screening for malignant neoplasm of colon: Secondary | ICD-10-CM | POA: Insufficient documentation

## 2022-10-20 DIAGNOSIS — Z8249 Family history of ischemic heart disease and other diseases of the circulatory system: Secondary | ICD-10-CM | POA: Diagnosis not present

## 2022-10-20 DIAGNOSIS — N809 Endometriosis, unspecified: Secondary | ICD-10-CM | POA: Diagnosis not present

## 2022-10-20 DIAGNOSIS — R221 Localized swelling, mass and lump, neck: Secondary | ICD-10-CM

## 2022-10-20 DIAGNOSIS — R768 Other specified abnormal immunological findings in serum: Secondary | ICD-10-CM

## 2022-10-20 NOTE — Progress Notes (Signed)
Subjective  CC:  Chief Complaint  Patient presents with   New Patient (Initial Visit)    HPI: Madison Brady is a 53 y.o. female who presents to  Primary Care at [facility] today to establish care with me as a new patient.  52 year old healthy female presents to establish care.  Prior PCP Dr. Terie Purser.  Last physical less than 1 year ago.  No chronic health conditions.  I reviewed chart including care from Care Everywhere in epic.  Updated problem list in detail.  Significant history is positive for family history of hypertrophic cardiomyopathy and brother.  Cardiovascular disease in brother and father, they were both smokers and had heart attacks later in life.  She has a history of endometriosis and fibroids status post partial hysterectomy.  She is postmenopausal.  She does see GYN.  She has a gastroenterologist and I reviewed her recent colonoscopy which showed diverticulosis only. She has the following concerns or needs: Right medial clavicle area with hard mass.  Has been there for months.  Nontender.  At times feels like it is choking her.  Reviewed chest x-ray.  Did not show any abnormality of the clavicular head.  She does have a neck CT scheduled for next week for further clarification.  History of osteochondroma on her right forehead that has been removed. She reports that she has been screened for hepatitis C and has seen liver care.  I reviewed notes from her gynecologist in 2015.  Hepatitis C antibody positive but RNA negative.  Workup was negative.  No further evaluation needed.  She has had hepatitis A and hepatitis B vaccines.  Need to clarify if she has had hepatitis A vaccine. History of right knee meniscal repair Social history: Has 2 grown children.  Her mother lives with her.  Non-smoker.  Works from home, Landscape architect.  Originally from [place]  Assessment  1. Neck mass   2. Endometriosis   3. Family history of hypertrophic cardiomyopathy   4. Morbid  obesity (HCC)   5. Hepatitis C antibody test positive       Plan  Neck mass: Await CT results.  Unremarkable x-ray.  Clinically palpable History of endometriosis status post hysterectomy.  No longer needs Pap smears.  She has a GYN. How maintenance: Colonoscopy is current.  Mammogram is current.  Eligible for Shingrix Morbid obesity: Recommend diet and exercise Reviewed hepatitis C antibody workup.  Negative  Follow up: 3 months for complete physical No orders of the defined types were placed in this encounter.  No orders of the defined types were placed in this encounter.       10/20/2022    2:23 PM  Depression screen PHQ 2/9  Decreased Interest 1  Down, Depressed, Hopeless 0  PHQ - 2 Score 1  Altered sleeping 0  Tired, decreased energy 1  Change in appetite 0  Feeling bad or failure about yourself  0  Trouble concentrating 0  Moving slowly or fidgety/restless 0  Suicidal thoughts 0  PHQ-9 Score 2  Difficult doing work/chores Not difficult at all    We updated and reviewed the patient's past history in detail and it is documented below.  Patient Active Problem List   Diagnosis Date Noted   Screening for colorectal cancer 10/20/2022    Colonoscopy 2023, [facility]. Normal with few tics    Endometriosis 10/20/2022    Status post partial hysterectomy    Hepatitis C antibody test positive 10/20/2022  She reports that she has been screened for hepatitis C and has seen liver care.  I reviewed notes from her gynecologist in 2015.  Hepatitis C antibody positive but RNA negative.  Workup was negative.  No further evaluation needed.  She has had hepatitis A and hepatitis B vaccines.  Need to clarify if she has had hepatitis A vaccine.    Family history of hypertrophic cardiomyopathy 12/25/2013    Normal echocardiogram 2015    Health Maintenance  Topic Date Due   HIV Screening  Never done   Hepatitis C Screening  Never done   Zoster Vaccines- Shingrix (1 of 2) Never done    COVID-19 Vaccine (4 - 2023-24 season) 11/05/2022 (Originally 04/09/2022)   INFLUENZA VACCINE  12/23/2022   MAMMOGRAM  01/18/2024   DTaP/Tdap/Td (2 - Td or Tdap) 06/11/2030   Colonoscopy  03/30/2032   HPV VACCINES  Aged Out   Immunization History  Administered Date(s) Administered   Hepatitis A, Ped/Adol-2 Dose 07/23/2014   Hepatitis B, PED/ADOLESCENT 07/23/2014   Influenza-Unspecified 03/09/2011, 02/12/2022   PFIZER(Purple Top)SARS-COV-2 Vaccination 12/25/2019, 01/15/2020   Pfizer Covid-19 Vaccine Bivalent Booster 58yrs & up 02/12/2022   Tdap 06/11/2020   No outpatient medications have been marked as taking for the 10/20/22 encounter (Office Visit) with Willow Ora, MD.    Allergies: Patient is allergic to codeine, sulfa antibiotics, diphenhydramine-zinc acetate, doxycycline, and ranitidine hcl. Past Medical History Patient  has a past medical history of Allergy (03/08/2022), Cyst, Dysmenorrhea, Osteoma, and Torn ACL. Past Surgical History Patient  has a past surgical history that includes Abdominal hysterectomy (05/24/2009); Tubal ligation (05/24/2001); Breast reduction surgery (05/24/2000); Knee cartilage surgery (Right); and Cyst excision (Right). Family History: Patient family history includes Breast cancer in her maternal grandmother; COPD in her mother; Diabetes in her father and paternal grandfather; Heart attack in her brother; Heart disease in her father; Hypertension in her father; Hypertrophic cardiomyopathy in her brother; Lung cancer in her father; Ovarian cancer in her paternal aunt. Social History:  Patient  reports that she has never smoked. She has never used smokeless tobacco. She reports current alcohol use. She reports that she does not use drugs.  Review of Systems: Constitutional: negative for fever or malaise Ophthalmic: negative for photophobia, double vision or loss of vision Cardiovascular: negative for chest pain, dyspnea on exertion, or new LE  swelling Respiratory: negative for SOB or persistent cough Gastrointestinal: negative for abdominal pain, change in bowel habits or melena Genitourinary: negative for dysuria or gross hematuria Musculoskeletal: negative for new gait disturbance or muscular weakness Integumentary: negative for new or persistent rashes Neurological: negative for TIA or stroke symptoms Psychiatric: negative for SI or delusions Allergic/Immunologic: negative for hives  Patient Care Team    Relationship Specialty Notifications Start End  Willow Ora, MD PCP - General Family Medicine  10/20/22   Henreitta Leber, PA-C Physician Assistant Obstetrics and Gynecology  10/20/22   Napoleon Form, MD Consulting Physician Gastroenterology  10/20/22   Roseanne Reno, DPM Consulting Physician Podiatry  10/20/22   Paulita Cradle., MD Referring Physician Orthopedic Surgery  10/20/22     Objective  Vitals: BP 115/77 (BP Location: Left Arm, Patient Position: Sitting, Cuff Size: Large)   Pulse 78   Temp 98.2 F (36.8 C) (Temporal)   Ht 5\' 1"  (1.549 m)   Wt 214 lb 9.6 oz (97.3 kg)   SpO2 97%   BMI 40.55 kg/m  General:  Well developed, well nourished, no acute distress  Psych:  Alert and oriented,normal mood and affect HEENT:  Normocephalic, atraumatic, non-icteric sclera, supple neck without adenopathy, mass or thyromegaly Cardiovascular:  RRR without gallop, rub or murmur Respiratory:  Good breath sounds bilaterally, CTAB with normal respiratory effort MSK: Right medial head of the clavicle feels enlarged, nontender.  Nonmobile   Commons side effects, risks, benefits, and alternatives for medications and treatment plan prescribed today were discussed, and the patient expressed understanding of the given instructions. Patient is instructed to call or message via MyChart if he/she has any questions or concerns regarding our treatment plan. No barriers to understanding were identified. We discussed Red Flag  symptoms and signs in detail. Patient expressed understanding regarding what to do in case of urgent or emergency type symptoms.  Medication list was reconciled, printed and provided to the patient in AVS. Patient instructions and summary information was reviewed with the patient as documented in the AVS. This note was prepared with assistance of Dragon voice recognition software. Occasional wrong-word or sound-a-like substitutions may have occurred due to the inherent limitations of voice recognition software  This visit occurred during the SARS-CoV-2 public health emergency.  Safety protocols were in place, including screening questions prior to the visit, additional usage of staff PPE, and extensive cleaning of exam room while observing appropriate contact time as indicated for disinfecting solutions.

## 2022-10-27 ENCOUNTER — Encounter: Payer: Self-pay | Admitting: Family Medicine

## 2022-10-28 ENCOUNTER — Ambulatory Visit
Admission: RE | Admit: 2022-10-28 | Discharge: 2022-10-28 | Disposition: A | Discharge status: Home / Self Care | Payer: No Typology Code available for payment source | Source: Ambulatory Visit | Attending: Family Medicine | Admitting: Family Medicine

## 2022-10-28 DIAGNOSIS — M898X1 Other specified disorders of bone, shoulder: Secondary | ICD-10-CM

## 2022-12-02 ENCOUNTER — Encounter: Payer: Self-pay | Admitting: Family Medicine

## 2022-12-02 ENCOUNTER — Ambulatory Visit (INDEPENDENT_AMBULATORY_CARE_PROVIDER_SITE_OTHER): Payer: No Typology Code available for payment source | Admitting: Family Medicine

## 2022-12-02 ENCOUNTER — Other Ambulatory Visit: Payer: Self-pay

## 2022-12-02 ENCOUNTER — Telehealth: Payer: Self-pay | Admitting: Family Medicine

## 2022-12-02 VITALS — BP 110/66 | HR 87 | Temp 98.1°F | Ht 61.0 in | Wt 215.6 lb

## 2022-12-02 DIAGNOSIS — Z23 Encounter for immunization: Secondary | ICD-10-CM

## 2022-12-02 DIAGNOSIS — R768 Other specified abnormal immunological findings in serum: Secondary | ICD-10-CM

## 2022-12-02 DIAGNOSIS — R221 Localized swelling, mass and lump, neck: Secondary | ICD-10-CM

## 2022-12-02 DIAGNOSIS — Z0000 Encounter for general adult medical examination without abnormal findings: Secondary | ICD-10-CM | POA: Diagnosis not present

## 2022-12-02 DIAGNOSIS — Z13220 Encounter for screening for lipoid disorders: Secondary | ICD-10-CM | POA: Diagnosis not present

## 2022-12-02 LAB — LIPID PANEL
Cholesterol: 210 mg/dL — ABNORMAL HIGH (ref 0–200)
HDL: 53 mg/dL (ref >39.00)
LDL Cholesterol: 132 mg/dL — ABNORMAL HIGH (ref 0–99)
NonHDL: 157.01
Total CHOL/HDL Ratio: 4
Triglycerides: 123 mg/dL (ref 0.0–149.0)
VLDL: 24.6 mg/dL (ref 0.0–40.0)

## 2022-12-02 LAB — COMPREHENSIVE METABOLIC PANEL
ALT: 18 U/L (ref 0–35)
AST: 17 U/L (ref 0–37)
Albumin: 4.2 g/dL (ref 3.5–5.2)
Alkaline Phosphatase: 49 U/L (ref 39–117)
BUN: 18 mg/dL (ref 6–23)
CO2: 26 mEq/L (ref 19–32)
Calcium: 9 mg/dL (ref 8.4–10.5)
Chloride: 103 mEq/L (ref 96–112)
Creatinine, Ser: 0.87 mg/dL (ref 0.40–1.20)
GFR: 76.52 mL/min (ref >60.00)
Glucose, Bld: 89 mg/dL (ref 70–99)
Potassium: 4 mEq/L (ref 3.5–5.1)
Sodium: 139 mEq/L (ref 135–145)
Total Bilirubin: 0.7 mg/dL (ref 0.2–1.2)
Total Protein: 6.7 g/dL (ref 6.0–8.3)

## 2022-12-02 LAB — CBC WITH DIFFERENTIAL/PLATELET
Basophils Absolute: 0 10*3/uL (ref 0.0–0.1)
Basophils Relative: 0.8 % (ref 0.0–3.0)
Eosinophils Absolute: 0.2 10*3/uL (ref 0.0–0.7)
Eosinophils Relative: 4 % (ref 0.0–5.0)
HCT: 39.9 % (ref 36.0–46.0)
Hemoglobin: 13.1 g/dL (ref 12.0–15.0)
Lymphocytes Relative: 27.5 % (ref 12.0–46.0)
Lymphs Abs: 1.6 10*3/uL (ref 0.7–4.0)
MCHC: 32.7 g/dL (ref 30.0–36.0)
MCV: 92.2 fl (ref 78.0–100.0)
Monocytes Absolute: 0.5 10*3/uL (ref 0.1–1.0)
Monocytes Relative: 7.6 % (ref 3.0–12.0)
Neutro Abs: 3.6 10*3/uL (ref 1.4–7.7)
Neutrophils Relative %: 60.1 % (ref 43.0–77.0)
Platelets: 268 10*3/uL (ref 150.0–400.0)
RBC: 4.33 Mil/uL (ref 3.87–5.11)
RDW: 14.3 % (ref 11.5–15.5)
WBC: 6 10*3/uL (ref 4.0–10.5)

## 2022-12-02 LAB — VITAMIN D 25 HYDROXY (VIT D DEFICIENCY, FRACTURES): VITD: 32.46 ng/mL (ref 30.00–100.00)

## 2022-12-02 LAB — TSH: TSH: 3.88 u[IU]/mL (ref 0.35–5.50)

## 2022-12-02 NOTE — Patient Instructions (Addendum)
Please return in 12 months for your annual complete physical; please come fasting. Schedule a nurse visit in 2-6 months to receive your 2nd of 2 Shingrix vaccination. You received your first today.  I will release your lab results to you on your MyChart account with further instructions. You may see the results before I do, but when I review them I will send you a message with my report or have my assistant call you if things need to be discussed. Please reply to my message with any questions. Thank you!   If you have any questions or concerns, please don't hesitate to send me a message via MyChart or call the office at [phone]. Thank you for visiting with [place] today! It's our pleasure caring for you.   Please do these things to maintain good health!  Exercise at least 30-45 minutes a day,  4-5 days a week.  Eat a low-fat diet with lots of fruits and vegetables, up to 7-9 servings per day. Drink plenty of water daily. Try to drink 8 8oz glasses per day. Seatbelts can save your life. Always wear your seatbelt. Place Smoke Detectors on every level of your home and check batteries every year. Schedule an appointment with an eye doctor for an eye exam every 1-2 years Safe sex - use condoms to protect yourself from STDs if you could be exposed to these types of infections. Use birth control if you do not want to become pregnant and are sexually active. Avoid heavy alcohol use. If you drink, keep it to less than 2 drinks/day and not every day. Health Care Power of Attorney.  Choose someone you trust that could speak for you if you became unable to speak for yourself. Depression is common in our stressful world.If you're feeling down or losing interest in things you normally enjoy, please come in for a visit. If anyone is threatening or hurting you, please get help. Physical or Emotional Violence is never OK.

## 2022-12-02 NOTE — Progress Notes (Signed)
Subjective  Chief Complaint  Patient presents with   Annual Exam    Pt here for Annual Exam and is currently fasting     HPI: Madison Brady is a 52 y.o. female who presents to [facility] at [facility] today for a Female Wellness Visit.   Wellness Visit: annual visit with health maintenance review and exam without Pap  HM: screens are current. Fasting. Doing well. ? Right neck mass: reviewed ct spine ct w/o abnormality. ? Due to spurring at sternoclavicular joint. No soft tissue or osseous mass identified.   Assessment  1. Annual physical exam   2. Need for shingles vaccine      Plan  Female Wellness Visit: Age appropriate Health Maintenance and Prevention measures were discussed with patient. Included topics are cancer screening recommendations, ways to keep healthy (see AVS) including dietary and exercise recommendations, regular eye and dental care, use of seat belts, and avoidance of moderate alcohol use and tobacco use.  BMI: discussed patient's BMI and encouraged positive lifestyle modifications to help get to or maintain a target BMI. HM needs and immunizations were addressed and ordered. See below for orders. See HM and immunization section for updates. Shingrix #1 given today Routine labs and screening tests ordered including cmp, cbc and lipids where appropriate. Discussed recommendations regarding Vit D and calcium supplementation (see AVS) Discussed likely asymmetry at SCV joint. Pt to follow. Rec neck CT w/ contrast or ent if needed.   Follow up: 12 mo for cpe  Orders Placed This Encounter  Procedures   Zoster Recombinant (Shingrix )   VITAMIN D 25 Hydroxy (Vit-D Deficiency, Fractures)   CBC with Differential/Platelet   Comprehensive metabolic panel   Lipid panel   TSH   HIV Antibody (routine testing w rflx)   No orders of the defined types were placed in this encounter.     Body mass index is 40.74 kg/m. Wt Readings from Last 3  Encounters:  12/02/22 215 lb 9.6 oz (97.8 kg)  10/20/22 214 lb 9.6 oz (97.3 kg)  03/30/22 209 lb (94.8 kg)     Patient Active Problem List   Diagnosis Date Noted Date Diagnosed   Screening for colorectal cancer 10/20/2022     Colonoscopy 2023, [facility]. Normal with few tics    Endometriosis 10/20/2022     Status post partial hysterectomy    Hepatitis C antibody test positive 10/20/2022     She reports that she has been screened for hepatitis C and has seen liver care.  I reviewed notes from her gynecologist in 2015.  Hepatitis C antibody positive but RNA negative.  Workup was negative.  No further evaluation needed.  She has had hepatitis A and hepatitis B vaccines.  Need to clarify if she has had hepatitis A vaccine.    Family history of hypertrophic cardiomyopathy 12/25/2013     Normal echocardiogram 2015    Health Maintenance  Topic Date Due   HIV Screening  Never done   Zoster Vaccines- Shingrix (1 of 2) Never done   COVID-19 Vaccine (4 - 2023-24 season) 12/18/2022 (Originally 04/09/2022)   INFLUENZA VACCINE  12/23/2022   MAMMOGRAM  01/18/2024   DTaP/Tdap/Td (2 - Td or Tdap) 06/11/2030   Colonoscopy  03/30/2032   Hepatitis C Screening  Completed   HPV VACCINES  Aged Out   Immunization History  Administered Date(s) Administered   Hepatitis A, Ped/Adol-2 Dose 07/23/2014   Hepatitis B, PED/ADOLESCENT 07/23/2014   Influenza-Unspecified 03/09/2011, 02/12/2022  PFIZER(Purple Top)SARS-COV-2 Vaccination 12/25/2019, 01/15/2020   Pfizer Covid-19 Vaccine Bivalent Booster 88yrs & up 02/12/2022   Tdap 06/11/2020   We updated and reviewed the patient's past history in detail and it is documented below. Allergies: Patient is allergic to codeine, sulfa antibiotics, diphenhydramine-zinc acetate, doxycycline, and ranitidine hcl. Past Medical History Patient  has a past medical history of Allergy (03/08/2022), Cyst, Dysmenorrhea, Osteoma, and Torn ACL. Past Surgical  History Patient  has a past surgical history that includes Abdominal hysterectomy (05/24/2009); Tubal ligation (05/24/2001); Breast reduction surgery (05/24/2000); Knee cartilage surgery (Right); and Cyst excision (Right). Family History: Patient family history includes Breast cancer in her maternal grandmother; COPD in her mother; Diabetes in her father and paternal grandfather; Heart attack in her brother; Heart disease in her father; Hypertension in her father; Hypertrophic cardiomyopathy in her brother; Lung cancer in her father; Ovarian cancer in her paternal aunt. Social History:  Patient  reports that she has never smoked. She has never used smokeless tobacco. She reports current alcohol use. She reports that she does not use drugs.  Review of Systems: Constitutional: negative for fever or malaise Ophthalmic: negative for photophobia, double vision or loss of vision Cardiovascular: negative for chest pain, dyspnea on exertion, or new LE swelling Respiratory: negative for SOB or persistent cough Gastrointestinal: negative for abdominal pain, change in bowel habits or melena Genitourinary: negative for dysuria or gross hematuria, no abnormal uterine bleeding or disharge Musculoskeletal: negative for new gait disturbance or muscular weakness Integumentary: negative for new or persistent rashes, no breast lumps Neurological: negative for TIA or stroke symptoms Psychiatric: negative for SI or delusions Allergic/Immunologic: negative for hives  Patient Care Team    Relationship Specialty Notifications Start End  Willow Ora, MD PCP - General Family Medicine  10/20/22   Henreitta Leber, PA-C Physician Assistant Obstetrics and Gynecology  10/20/22   Napoleon Form, MD Consulting Physician Gastroenterology  10/20/22   Roseanne Reno, DPM Consulting Physician Podiatry  10/20/22   Paulita Cradle., MD Referring Physician Orthopedic Surgery  10/20/22     Objective  Vitals: BP  110/66   Pulse 87   Temp 98.1 F (36.7 C)   Ht 5\' 1"  (1.549 m)   Wt 215 lb 9.6 oz (97.8 kg)   SpO2 95%   BMI 40.74 kg/m  General:  Well developed, well nourished, no acute distress  Psych:  Alert and orientedx3,normal mood and affect HEENT:  Normocephalic, atraumatic, non-icteric sclera,  supple neck without adenopathy, mass or thyromegaly, prominent right SCV joint, non-tneder Cardiovascular:  Normal S1, S2, RRR without gallop, rub or murmur Respiratory:  Good breath sounds bilaterally, CTAB with normal respiratory effort Gastrointestinal: normal bowel sounds, soft, non-tender, no noted masses. No HSM MSK: extremities without edema, joints without erythema or swelling  Commons side effects, risks, benefits, and alternatives for medications and treatment plan prescribed today were discussed, and the patient expressed understanding of the given instructions. Patient is instructed to call or message via MyChart if he/she has any questions or concerns regarding our treatment plan. No barriers to understanding were identified. We discussed Red Flag symptoms and signs in detail. Patient expressed understanding regarding what to do in case of urgent or emergency type symptoms.  Medication list was reconciled, printed and provided to the patient in AVS. Patient instructions and summary information was reviewed with the patient as documented in the AVS. This note was prepared with assistance of Dragon voice recognition software. Occasional wrong-word or sound-a-like substitutions may  have occurred due to the inherent limitations of voice recognition software

## 2022-12-02 NOTE — Telephone Encounter (Signed)
Patient requests to go ahead and send Referral for ENT as discussed at office visit 12/02/22

## 2022-12-02 NOTE — Telephone Encounter (Signed)
Referral has been placed. 

## 2022-12-03 LAB — HIV ANTIBODY (ROUTINE TESTING W REFLEX): HIV 1&2 Ab, 4th Generation: NONREACTIVE

## 2022-12-07 NOTE — Progress Notes (Signed)
Labs reviewed.  The 10-year ASCVD risk score (Arnett DK, et al., 2019) is: 1.2%   Values used to calculate the score:     Age: 52 years     Sex: Female     Is Non-Hispanic African American: No     Diabetic: No     Tobacco smoker: No     Systolic Blood Pressure: 110 mmHg     Is BP treated: No     HDL Cholesterol: 53 mg/dL     Total Cholesterol: 210 mg/dL

## 2022-12-14 ENCOUNTER — Encounter: Payer: Self-pay | Admitting: Family Medicine

## 2023-01-06 ENCOUNTER — Encounter (INDEPENDENT_AMBULATORY_CARE_PROVIDER_SITE_OTHER): Payer: Self-pay

## 2023-02-09 ENCOUNTER — Ambulatory Visit: Payer: No Typology Code available for payment source | Admitting: *Deleted

## 2023-02-09 DIAGNOSIS — Z23 Encounter for immunization: Secondary | ICD-10-CM | POA: Diagnosis not present

## 2023-02-09 NOTE — Progress Notes (Signed)
  Per orders of Dr. Mardelle Matte, injection of 2nd Shingles Vaccine given in right deltoid per patient preference by Jobe Gibbon, CMA. Patient tolerated injection well.

## 2023-02-24 ENCOUNTER — Encounter: Payer: Self-pay | Admitting: Family Medicine

## 2023-03-01 ENCOUNTER — Ambulatory Visit: Payer: No Typology Code available for payment source | Admitting: Family Medicine

## 2023-03-01 ENCOUNTER — Encounter: Payer: Self-pay | Admitting: Family Medicine

## 2023-03-01 DIAGNOSIS — M542 Cervicalgia: Secondary | ICD-10-CM

## 2023-03-01 DIAGNOSIS — M545 Low back pain, unspecified: Secondary | ICD-10-CM | POA: Diagnosis not present

## 2023-03-01 DIAGNOSIS — G8911 Acute pain due to trauma: Secondary | ICD-10-CM

## 2023-03-01 DIAGNOSIS — R519 Headache, unspecified: Secondary | ICD-10-CM | POA: Diagnosis not present

## 2023-03-01 MED ORDER — DICLOFENAC SODIUM 75 MG PO TBEC: 75.0000 mg | DELAYED_RELEASE_TABLET | Freq: Two times a day (BID) | ORAL | 0 refills | Status: DC
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
## 2023-03-03 ENCOUNTER — Ambulatory Visit: Payer: No Typology Code available for payment source | Admitting: Physical Therapy

## 2023-03-03 ENCOUNTER — Encounter: Payer: Self-pay | Admitting: Physical Therapy

## 2023-03-03 DIAGNOSIS — M542 Cervicalgia: Secondary | ICD-10-CM | POA: Diagnosis not present

## 2023-03-03 DIAGNOSIS — M5459 Other low back pain: Secondary | ICD-10-CM | POA: Diagnosis not present
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
## 2023-03-07 ENCOUNTER — Encounter: Payer: No Typology Code available for payment source | Admitting: Physical Therapy

## 2023-03-10 ENCOUNTER — Ambulatory Visit: Payer: No Typology Code available for payment source | Admitting: Physical Therapy

## 2023-03-10 ENCOUNTER — Encounter: Payer: Self-pay | Admitting: Physical Therapy

## 2023-03-10 DIAGNOSIS — M5459 Other low back pain: Secondary | ICD-10-CM | POA: Diagnosis not present

## 2023-03-10 DIAGNOSIS — M542 Cervicalgia: Secondary | ICD-10-CM | POA: Diagnosis not present
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
## 2023-03-14 ENCOUNTER — Ambulatory Visit (INDEPENDENT_AMBULATORY_CARE_PROVIDER_SITE_OTHER): Payer: No Typology Code available for payment source | Admitting: Physical Therapy

## 2023-03-14 DIAGNOSIS — M5459 Other low back pain: Secondary | ICD-10-CM

## 2023-03-14 DIAGNOSIS — M542 Cervicalgia: Secondary | ICD-10-CM
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
## 2023-03-15 ENCOUNTER — Encounter: Payer: Self-pay | Admitting: Physical Therapy

## 2023-03-16 ENCOUNTER — Encounter: Payer: Self-pay | Admitting: Physical Therapy

## 2023-03-16 ENCOUNTER — Ambulatory Visit (INDEPENDENT_AMBULATORY_CARE_PROVIDER_SITE_OTHER): Payer: No Typology Code available for payment source | Admitting: Physical Therapy

## 2023-03-16 DIAGNOSIS — M542 Cervicalgia: Secondary | ICD-10-CM
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
## 2023-03-21 ENCOUNTER — Encounter: Payer: No Typology Code available for payment source | Admitting: Physical Therapy

## 2023-03-23 ENCOUNTER — Ambulatory Visit (INDEPENDENT_AMBULATORY_CARE_PROVIDER_SITE_OTHER): Payer: No Typology Code available for payment source | Admitting: Physical Therapy

## 2023-03-23 DIAGNOSIS — M542 Cervicalgia: Secondary | ICD-10-CM

## 2023-03-24 ENCOUNTER — Encounter: Payer: Self-pay | Admitting: Physical Therapy
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
## 2023-03-28 ENCOUNTER — Ambulatory Visit (INDEPENDENT_AMBULATORY_CARE_PROVIDER_SITE_OTHER): Payer: No Typology Code available for payment source | Admitting: Physical Therapy

## 2023-03-28 ENCOUNTER — Encounter: Payer: Self-pay | Admitting: Physical Therapy

## 2023-03-28 DIAGNOSIS — M542 Cervicalgia: Secondary | ICD-10-CM
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
## 2023-03-30 ENCOUNTER — Encounter: Payer: No Typology Code available for payment source | Admitting: Physical Therapy

## 2023-04-04 ENCOUNTER — Encounter: Payer: Self-pay | Admitting: Physical Therapy

## 2023-04-04 ENCOUNTER — Ambulatory Visit (INDEPENDENT_AMBULATORY_CARE_PROVIDER_SITE_OTHER): Payer: No Typology Code available for payment source | Admitting: Physical Therapy

## 2023-04-04 DIAGNOSIS — M5459 Other low back pain: Secondary | ICD-10-CM

## 2023-04-04 DIAGNOSIS — M542 Cervicalgia: Secondary | ICD-10-CM
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
## 2023-04-07 ENCOUNTER — Ambulatory Visit: Payer: No Typology Code available for payment source | Admitting: Physical Therapy

## 2023-04-07 ENCOUNTER — Encounter: Payer: Self-pay | Admitting: Physical Therapy

## 2023-04-07 DIAGNOSIS — M542 Cervicalgia: Secondary | ICD-10-CM | POA: Diagnosis not present
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
## 2023-04-11 ENCOUNTER — Ambulatory Visit (INDEPENDENT_AMBULATORY_CARE_PROVIDER_SITE_OTHER): Payer: No Typology Code available for payment source | Admitting: Physical Therapy

## 2023-04-11 ENCOUNTER — Encounter: Payer: Self-pay | Admitting: Physical Therapy

## 2023-04-11 DIAGNOSIS — M542 Cervicalgia: Secondary | ICD-10-CM

## 2023-04-11 DIAGNOSIS — M5459 Other low back pain: Secondary | ICD-10-CM
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
## 2023-04-14 ENCOUNTER — Encounter: Payer: No Typology Code available for payment source | Admitting: Physical Therapy

## 2023-04-18 ENCOUNTER — Ambulatory Visit (INDEPENDENT_AMBULATORY_CARE_PROVIDER_SITE_OTHER): Payer: No Typology Code available for payment source | Admitting: Physical Therapy

## 2023-04-18 ENCOUNTER — Encounter: Payer: Self-pay | Admitting: Physical Therapy

## 2023-04-18 DIAGNOSIS — M542 Cervicalgia: Secondary | ICD-10-CM

## 2023-04-18 DIAGNOSIS — M5459 Other low back pain: Secondary | ICD-10-CM
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
## 2023-04-25 ENCOUNTER — Encounter: Payer: No Typology Code available for payment source | Admitting: Physical Therapy

## 2023-04-28 ENCOUNTER — Ambulatory Visit: Payer: No Typology Code available for payment source | Admitting: Physical Therapy

## 2023-04-28 ENCOUNTER — Encounter: Payer: Self-pay | Admitting: Physical Therapy

## 2023-04-28 DIAGNOSIS — M542 Cervicalgia: Secondary | ICD-10-CM

## 2023-04-28 DIAGNOSIS — M5459 Other low back pain: Secondary | ICD-10-CM
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
## 2023-05-05 ENCOUNTER — Encounter: Payer: Self-pay | Admitting: Physical Therapy

## 2023-05-05 ENCOUNTER — Ambulatory Visit: Payer: No Typology Code available for payment source | Admitting: Physical Therapy

## 2023-05-05 DIAGNOSIS — M542 Cervicalgia: Secondary | ICD-10-CM

## 2023-05-05 DIAGNOSIS — M5459 Other low back pain: Secondary | ICD-10-CM | POA: Diagnosis not present
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
## 2023-05-06 ENCOUNTER — Ambulatory Visit (INDEPENDENT_AMBULATORY_CARE_PROVIDER_SITE_OTHER): Payer: No Typology Code available for payment source | Admitting: Sports Medicine

## 2023-05-06 VITALS — BP 128/78 | HR 86 | Ht 61.0 in | Wt 219.0 lb

## 2023-05-06 DIAGNOSIS — M9908 Segmental and somatic dysfunction of rib cage: Secondary | ICD-10-CM

## 2023-05-06 DIAGNOSIS — M9903 Segmental and somatic dysfunction of lumbar region: Secondary | ICD-10-CM

## 2023-05-06 DIAGNOSIS — M546 Pain in thoracic spine: Secondary | ICD-10-CM

## 2023-05-06 DIAGNOSIS — M9901 Segmental and somatic dysfunction of cervical region: Secondary | ICD-10-CM | POA: Diagnosis not present

## 2023-05-06 DIAGNOSIS — M545 Low back pain, unspecified: Secondary | ICD-10-CM

## 2023-05-06 DIAGNOSIS — G8929 Other chronic pain: Secondary | ICD-10-CM

## 2023-05-06 DIAGNOSIS — M542 Cervicalgia: Secondary | ICD-10-CM | POA: Diagnosis not present

## 2023-05-06 DIAGNOSIS — M9905 Segmental and somatic dysfunction of pelvic region: Secondary | ICD-10-CM

## 2023-05-06 DIAGNOSIS — M9902 Segmental and somatic dysfunction of thoracic region: Secondary | ICD-10-CM

## 2023-05-06 MED ORDER — MELOXICAM 15 MG PO TABS: 15.0000 mg | ORAL_TABLET | Freq: Every day | ORAL | 0 refills | Status: DC

## 2023-05-06 NOTE — Patient Instructions (Addendum)
-   Start meloxicam 15 mg daily x2 weeks.  If still having pain after 2 weeks, complete 3rd-week of NSAID. May use remaining NSAID as needed once daily for pain control.  Do not to use additional over-the-counter NSAIDs (ibuprofen, naproxen, Advil, Aleve) while taking prescription NSAIDs.  May use Tylenol [phone] mg 2 to 3 times a day for breakthrough pain. Dont use Voltaren when using meloxicam  Trap HEP  3-4 week follow up

## 2023-05-12 ENCOUNTER — Encounter: Payer: Self-pay | Admitting: Physical Therapy

## 2023-05-12 ENCOUNTER — Ambulatory Visit (INDEPENDENT_AMBULATORY_CARE_PROVIDER_SITE_OTHER): Payer: No Typology Code available for payment source | Admitting: Physical Therapy

## 2023-05-12 DIAGNOSIS — M542 Cervicalgia: Secondary | ICD-10-CM

## 2023-05-12 DIAGNOSIS — M5459 Other low back pain: Secondary | ICD-10-CM
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
## 2023-05-13 ENCOUNTER — Encounter: Payer: No Typology Code available for payment source | Admitting: Physical Therapy

## 2023-05-16 ENCOUNTER — Encounter: Payer: Self-pay | Admitting: Physical Therapy

## 2023-05-16 ENCOUNTER — Ambulatory Visit (INDEPENDENT_AMBULATORY_CARE_PROVIDER_SITE_OTHER): Payer: No Typology Code available for payment source | Admitting: Physical Therapy

## 2023-05-16 DIAGNOSIS — M542 Cervicalgia: Secondary | ICD-10-CM

## 2023-05-16 DIAGNOSIS — M5459 Other low back pain: Secondary | ICD-10-CM
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
## 2023-05-26 ENCOUNTER — Encounter: Payer: Self-pay | Admitting: Physical Therapy

## 2023-05-26 ENCOUNTER — Ambulatory Visit: Payer: No Typology Code available for payment source | Admitting: Physical Therapy

## 2023-05-26 DIAGNOSIS — M542 Cervicalgia: Secondary | ICD-10-CM | POA: Diagnosis not present

## 2023-05-26 DIAGNOSIS — M5459 Other low back pain: Secondary | ICD-10-CM | POA: Diagnosis not present
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
## 2023-05-27 ENCOUNTER — Ambulatory Visit (INDEPENDENT_AMBULATORY_CARE_PROVIDER_SITE_OTHER): Payer: No Typology Code available for payment source | Admitting: Sports Medicine

## 2023-05-27 VITALS — BP 110/80 | HR 93 | Ht 61.0 in | Wt 219.0 lb

## 2023-05-27 DIAGNOSIS — M545 Low back pain, unspecified: Secondary | ICD-10-CM | POA: Diagnosis not present

## 2023-05-27 DIAGNOSIS — M542 Cervicalgia: Secondary | ICD-10-CM | POA: Diagnosis not present

## 2023-05-27 DIAGNOSIS — M9903 Segmental and somatic dysfunction of lumbar region: Secondary | ICD-10-CM

## 2023-05-27 DIAGNOSIS — G8929 Other chronic pain: Secondary | ICD-10-CM

## 2023-05-27 DIAGNOSIS — M546 Pain in thoracic spine: Secondary | ICD-10-CM | POA: Diagnosis not present

## 2023-05-27 DIAGNOSIS — M9902 Segmental and somatic dysfunction of thoracic region: Secondary | ICD-10-CM

## 2023-05-27 DIAGNOSIS — M9901 Segmental and somatic dysfunction of cervical region: Secondary | ICD-10-CM | POA: Diagnosis not present

## 2023-05-27 DIAGNOSIS — M9908 Segmental and somatic dysfunction of rib cage: Secondary | ICD-10-CM

## 2023-05-27 DIAGNOSIS — M9905 Segmental and somatic dysfunction of pelvic region: Secondary | ICD-10-CM

## 2023-05-27 MED ORDER — MELOXICAM 15 MG PO TABS: 15.0000 mg | ORAL_TABLET | Freq: Every day | ORAL | 0 refills | Status: AC

## 2023-05-27 NOTE — Patient Instructions (Signed)
 Tylenol [phone] mg 2-3 times a day for pain relief  Discontinue daily meloxicam , may use meloxicam daily for breakthrough pain  4 week follow up [facility]

## 2023-06-02 ENCOUNTER — Encounter: Payer: Self-pay | Admitting: Physical Therapy

## 2023-06-02 ENCOUNTER — Ambulatory Visit (INDEPENDENT_AMBULATORY_CARE_PROVIDER_SITE_OTHER): Payer: No Typology Code available for payment source | Admitting: Physical Therapy

## 2023-06-02 DIAGNOSIS — M542 Cervicalgia: Secondary | ICD-10-CM | POA: Diagnosis not present

## 2023-06-02 DIAGNOSIS — M5459 Other low back pain: Secondary | ICD-10-CM | POA: Diagnosis not present
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
## 2023-06-24 ENCOUNTER — Ambulatory Visit (INDEPENDENT_AMBULATORY_CARE_PROVIDER_SITE_OTHER): Payer: No Typology Code available for payment source

## 2023-06-24 ENCOUNTER — Ambulatory Visit (INDEPENDENT_AMBULATORY_CARE_PROVIDER_SITE_OTHER): Payer: No Typology Code available for payment source | Admitting: Sports Medicine

## 2023-06-24 VITALS — BP 126/80 | HR 93 | Ht 61.0 in | Wt 219.0 lb

## 2023-06-24 DIAGNOSIS — G8929 Other chronic pain: Secondary | ICD-10-CM

## 2023-06-24 DIAGNOSIS — M546 Pain in thoracic spine: Secondary | ICD-10-CM

## 2023-06-24 DIAGNOSIS — M9908 Segmental and somatic dysfunction of rib cage: Secondary | ICD-10-CM

## 2023-06-24 DIAGNOSIS — M542 Cervicalgia: Secondary | ICD-10-CM

## 2023-06-24 DIAGNOSIS — M9903 Segmental and somatic dysfunction of lumbar region: Secondary | ICD-10-CM

## 2023-06-24 DIAGNOSIS — M9901 Segmental and somatic dysfunction of cervical region: Secondary | ICD-10-CM | POA: Diagnosis not present

## 2023-06-24 DIAGNOSIS — M9905 Segmental and somatic dysfunction of pelvic region: Secondary | ICD-10-CM

## 2023-06-24 DIAGNOSIS — M9902 Segmental and somatic dysfunction of thoracic region: Secondary | ICD-10-CM

## 2023-07-08 ENCOUNTER — Encounter: Payer: Self-pay | Admitting: Sports Medicine
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
## 2023-07-22 ENCOUNTER — Ambulatory Visit (INDEPENDENT_AMBULATORY_CARE_PROVIDER_SITE_OTHER): Payer: Self-pay | Admitting: Sports Medicine

## 2023-07-22 VITALS — HR 73 | Ht 61.0 in | Wt 221.0 lb

## 2023-07-22 DIAGNOSIS — M542 Cervicalgia: Secondary | ICD-10-CM

## 2023-07-22 DIAGNOSIS — S46811A Strain of other muscles, fascia and tendons at shoulder and upper arm level, right arm, initial encounter: Secondary | ICD-10-CM

## 2023-08-01 ENCOUNTER — Telehealth: Payer: Self-pay | Admitting: Sports Medicine

## 2023-08-01 NOTE — Telephone Encounter (Signed)
 [employer] form "Verification of Treatment by Attending Physician", completed and faxed to [employer] fax [phone]. Copy to scan.

## 2023-08-11 ENCOUNTER — Telehealth: Payer: Self-pay

## 2023-08-11 NOTE — Telephone Encounter (Signed)
 Copied from CRM [id number]. Topic: Medical Record Request - Other >> Aug 10, 2023  4:52 PM Florestine Avers wrote: Reason for CRM: Patient called in stating that Dr. Mardelle Matte was supposed to complete a medical release form and release the records for after the patient was seen in September. Patient is requesting a phone call to ensure that Dr. Mardelle Matte has completed the medical release portion on her end and sends off the records.
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
## 2023-08-19 ENCOUNTER — Ambulatory Visit: Payer: Self-pay | Admitting: Sports Medicine

## 2023-08-19 VITALS — HR 73 | Ht 61.0 in | Wt 216.0 lb

## 2023-08-19 DIAGNOSIS — M9908 Segmental and somatic dysfunction of rib cage: Secondary | ICD-10-CM

## 2023-08-19 DIAGNOSIS — M542 Cervicalgia: Secondary | ICD-10-CM | POA: Diagnosis not present

## 2023-08-19 DIAGNOSIS — M9901 Segmental and somatic dysfunction of cervical region: Secondary | ICD-10-CM

## 2023-08-19 DIAGNOSIS — G8929 Other chronic pain: Secondary | ICD-10-CM

## 2023-08-19 DIAGNOSIS — M546 Pain in thoracic spine: Secondary | ICD-10-CM | POA: Diagnosis not present

## 2023-08-19 DIAGNOSIS — M9902 Segmental and somatic dysfunction of thoracic region: Secondary | ICD-10-CM

## 2023-08-19 DIAGNOSIS — S46811D Strain of other muscles, fascia and tendons at shoulder and upper arm level, right arm, subsequent encounter: Secondary | ICD-10-CM | POA: Diagnosis not present

## 2023-08-19 NOTE — Patient Instructions (Addendum)
 Neck MRI  Follow up 5 days after to discuss results

## 2023-08-26 ENCOUNTER — Ambulatory Visit
Admission: RE | Admit: 2023-08-26 | Discharge: 2023-08-26 | Disposition: A | Discharge status: Home / Self Care | Source: Ambulatory Visit | Attending: Sports Medicine | Admitting: Sports Medicine

## 2023-08-26 DIAGNOSIS — G8929 Other chronic pain: Secondary | ICD-10-CM

## 2023-08-26 DIAGNOSIS — S46811D Strain of other muscles, fascia and tendons at shoulder and upper arm level, right arm, subsequent encounter: Secondary | ICD-10-CM

## 2023-08-26 DIAGNOSIS — M542 Cervicalgia: Secondary | ICD-10-CM
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
## 2023-09-02 ENCOUNTER — Ambulatory Visit: Admitting: Sports Medicine

## 2023-09-02 VITALS — BP 120/82 | HR 88 | Ht 61.0 in | Wt 216.0 lb

## 2023-09-02 DIAGNOSIS — M5020 Other cervical disc displacement, unspecified cervical region: Secondary | ICD-10-CM | POA: Diagnosis not present

## 2023-09-02 DIAGNOSIS — M542 Cervicalgia: Secondary | ICD-10-CM

## 2023-09-02 NOTE — Patient Instructions (Signed)
 C6-7 symptomatic on right but pathology is on left  Follow up 2 weeks after to discuss results

## 2023-09-12 ENCOUNTER — Other Ambulatory Visit

## 2023-09-14 NOTE — Discharge Instructions (Signed)

## 2023-09-15 ENCOUNTER — Ambulatory Visit
Admission: RE | Admit: 2023-09-15 | Discharge: 2023-09-15 | Disposition: A | Discharge status: Home / Self Care | Source: Ambulatory Visit | Attending: Sports Medicine | Admitting: Sports Medicine

## 2023-09-15 DIAGNOSIS — M5020 Other cervical disc displacement, unspecified cervical region: Secondary | ICD-10-CM

## 2023-09-15 DIAGNOSIS — M542 Cervicalgia: Secondary | ICD-10-CM

## 2023-09-15 MED ORDER — TRIAMCINOLONE ACETONIDE 40 MG/ML IJ SUSP (RADIOLOGY)
60.0000 mg | Freq: Once | INTRAMUSCULAR | Status: AC
  Administered 2023-09-15: 60 mg via EPIDURAL

## 2023-09-15 MED ORDER — IOPAMIDOL (ISOVUE-M 300) INJECTION 61%
1.0000 mL | Freq: Once | INTRAMUSCULAR | Status: AC | PRN
  Administered 2023-09-15: 1 mL via EPIDURAL
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
## 2023-09-29 ENCOUNTER — Ambulatory Visit: Admitting: Sports Medicine

## 2023-09-29 VITALS — HR 85 | Ht 61.0 in | Wt 216.0 lb

## 2023-09-29 DIAGNOSIS — M542 Cervicalgia: Secondary | ICD-10-CM

## 2023-09-29 DIAGNOSIS — M5020 Other cervical disc displacement, unspecified cervical region: Secondary | ICD-10-CM

## 2023-09-29 NOTE — Patient Instructions (Signed)
 Epidural right C6-7  Follow up 2 weeks after to discuss results

## 2023-10-03 NOTE — Discharge Instructions (Signed)

## 2023-10-04 ENCOUNTER — Ambulatory Visit
Admission: RE | Admit: 2023-10-04 | Discharge: 2023-10-04 | Disposition: A | Discharge status: Home / Self Care | Source: Ambulatory Visit | Attending: Sports Medicine

## 2023-10-04 DIAGNOSIS — M542 Cervicalgia: Secondary | ICD-10-CM

## 2023-10-04 DIAGNOSIS — M5020 Other cervical disc displacement, unspecified cervical region: Secondary | ICD-10-CM

## 2023-10-04 MED ORDER — IOPAMIDOL (ISOVUE-M 300) INJECTION 61%
1.0000 mL | Freq: Once | INTRAMUSCULAR | Status: AC | PRN
  Administered 2023-10-04: 1 mL via EPIDURAL

## 2023-10-04 MED ORDER — TRIAMCINOLONE ACETONIDE 40 MG/ML IJ SUSP (RADIOLOGY)
60.0000 mg | Freq: Once | INTRAMUSCULAR | Status: AC
  Administered 2023-10-04: 60 mg via EPIDURAL
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
## 2023-10-19 ENCOUNTER — Ambulatory Visit: Admitting: Sports Medicine

## 2023-10-19 VITALS — BP 112/60 | HR 93 | Ht 61.0 in | Wt 218.0 lb

## 2023-10-19 DIAGNOSIS — M5020 Other cervical disc displacement, unspecified cervical region: Secondary | ICD-10-CM

## 2023-10-19 DIAGNOSIS — M542 Cervicalgia: Secondary | ICD-10-CM

## 2023-10-19 MED ORDER — KETOROLAC TROMETHAMINE 10 MG PO TABS: 10.0000 mg | ORAL_TABLET | Freq: Three times a day (TID) | ORAL | 0 refills | Status: DC | PRN

## 2023-10-19 NOTE — Patient Instructions (Addendum)
 Repeat epidural injection. Right sided C6-C7 Dr. Hercules Lombard. Neurosurgery referral. Follow up 2 weeks after injection.

## 2023-10-25 ENCOUNTER — Telehealth: Payer: Self-pay | Admitting: Sports Medicine

## 2023-10-25 ENCOUNTER — Other Ambulatory Visit: Payer: Self-pay | Admitting: Diagnostic Radiology

## 2023-10-25 DIAGNOSIS — Z0000 Encounter for general adult medical examination without abnormal findings: Secondary | ICD-10-CM

## 2023-10-25 NOTE — Telephone Encounter (Signed)
 Patient called stating that she got a call today in regards to scheduling her epidural.  The doctor that she prefers to use is out of the office the whole month of June and she does not want to see the other doctor so she is not going to proceed with the injection at this time.

## 2023-11-07 ENCOUNTER — Telehealth: Payer: Self-pay | Admitting: Sports Medicine

## 2023-11-07 NOTE — Telephone Encounter (Signed)
 Patient called to let Dr Cleora Daft know that she is scheduled for her Neuro consult tomorrow and asked when she should plan to follow up with Dr Cleora Daft after?  Please advise.

## 2023-11-07 NOTE — Telephone Encounter (Signed)
 Message relayed to patient in other mychart message.

## 2023-11-22 ENCOUNTER — Telehealth: Payer: Self-pay | Admitting: Sports Medicine

## 2023-11-22 NOTE — Telephone Encounter (Signed)
 Patient called to let Dr Leonce know that she is scheduled for her herniated disk surgery on July 24th. She asked when she should plan to follow up with Dr Leonce after?  Please advise.

## 2023-12-07 ENCOUNTER — Ambulatory Visit (INDEPENDENT_AMBULATORY_CARE_PROVIDER_SITE_OTHER): Payer: No Typology Code available for payment source | Admitting: Family Medicine

## 2023-12-07 ENCOUNTER — Encounter: Payer: Self-pay | Admitting: Family Medicine

## 2023-12-07 VITALS — BP 113/75 | HR 81 | Ht 61.0 in

## 2023-12-07 DIAGNOSIS — M4722 Other spondylosis with radiculopathy, cervical region: Secondary | ICD-10-CM | POA: Diagnosis not present

## 2023-12-07 DIAGNOSIS — Z13220 Encounter for screening for lipoid disorders: Secondary | ICD-10-CM | POA: Diagnosis not present

## 2023-12-07 DIAGNOSIS — Z0000 Encounter for general adult medical examination without abnormal findings: Secondary | ICD-10-CM | POA: Diagnosis not present

## 2023-12-07 LAB — COMPREHENSIVE METABOLIC PANEL WITH GFR
ALT: 28 U/L (ref 0–35)
AST: 27 U/L (ref 0–37)
Albumin: 4.2 g/dL (ref 3.5–5.2)
Alkaline Phosphatase: 44 U/L (ref 39–117)
BUN: 14 mg/dL (ref 6–23)
CO2: 30 meq/L (ref 19–32)
Calcium: 9.1 mg/dL (ref 8.4–10.5)
Chloride: 103 meq/L (ref 96–112)
Creatinine, Ser: 0.92 mg/dL (ref 0.40–1.20)
GFR: 71.05 mL/min (ref >60.00)
Glucose, Bld: 97 mg/dL (ref 70–99)
Potassium: 4.3 meq/L (ref 3.5–5.1)
Sodium: 141 meq/L (ref 135–145)
Total Bilirubin: 0.6 mg/dL (ref 0.2–1.2)
Total Protein: 7.1 g/dL (ref 6.0–8.3)

## 2023-12-07 LAB — LIPID PANEL
Cholesterol: 229 mg/dL — ABNORMAL HIGH (ref 0–200)
HDL: 50.9 mg/dL (ref >39.00)
LDL Cholesterol: 142 mg/dL — ABNORMAL HIGH (ref 0–99)
NonHDL: 177.62
Total CHOL/HDL Ratio: 4
Triglycerides: 177 mg/dL — ABNORMAL HIGH (ref 0.0–149.0)
VLDL: 35.4 mg/dL (ref 0.0–40.0)

## 2023-12-07 LAB — CBC WITH DIFFERENTIAL/PLATELET
Basophils Absolute: 0 K/uL (ref 0.0–0.1)
Basophils Relative: 0.9 % (ref 0.0–3.0)
Eosinophils Absolute: 0.3 K/uL (ref 0.0–0.7)
Eosinophils Relative: 5.9 % — ABNORMAL HIGH (ref 0.0–5.0)
HCT: 40.3 % (ref 36.0–46.0)
Hemoglobin: 13.3 g/dL (ref 12.0–15.0)
Lymphocytes Relative: 32.4 % (ref 12.0–46.0)
Lymphs Abs: 1.7 K/uL (ref 0.7–4.0)
MCHC: 32.9 g/dL (ref 30.0–36.0)
MCV: 92.9 fl (ref 78.0–100.0)
Monocytes Absolute: 0.5 K/uL (ref 0.1–1.0)
Monocytes Relative: 8.6 % (ref 3.0–12.0)
Neutro Abs: 2.8 K/uL (ref 1.4–7.7)
Neutrophils Relative %: 52.2 % (ref 43.0–77.0)
Platelets: 286 K/uL (ref 150.0–400.0)
RBC: 4.34 Mil/uL (ref 3.87–5.11)
RDW: 13.7 % (ref 11.5–15.5)
WBC: 5.4 K/uL (ref 4.0–10.5)

## 2023-12-07 LAB — VITAMIN D 25 HYDROXY (VIT D DEFICIENCY, FRACTURES): VITD: 43.51 ng/mL (ref 30.00–100.00)

## 2023-12-07 LAB — TSH: TSH: 6.04 u[IU]/mL — ABNORMAL HIGH (ref 0.35–5.50)

## 2023-12-07 NOTE — Patient Instructions (Signed)
 Please return in 12 months for your annual complete physical; please come fasting.   I will release your lab results to you on your MyChart account with further instructions. You may see the results before I do, but when I review them I will send you a message with my report or have my assistant call you if things need to be discussed. Please reply to my message with any questions. Thank you!   If you have any questions or concerns, please don't hesitate to send me a message via MyChart or call the office at [phone]. Thank you for visiting with Madison Brady today! It's our pleasure caring for you.   Preventive Care 66-54 Years Old, Female Preventive care refers to lifestyle choices and visits with your health care provider that can promote health and wellness. Preventive care visits are also called wellness exams. What can I expect for my preventive care visit? Counseling Your health care provider may ask you questions about your: Medical history, including: Past medical problems. Family medical history. Pregnancy history. Current health, including: Menstrual cycle. Method of birth control. Emotional well-being. Home life and relationship well-being. Sexual activity and sexual health. Lifestyle, including: Alcohol, nicotine or tobacco, and drug use. Access to firearms. Diet, exercise, and sleep habits. Work and work Astronomer. Sunscreen use. Safety issues such as seatbelt and bike helmet use. Physical exam Your health care provider will check your: Height and weight. These may be used to calculate your BMI (body mass index). BMI is a measurement that tells if you are at a healthy weight. Waist circumference. This measures the distance around your waistline. This measurement also tells if you are at a healthy weight and may help predict your risk of certain diseases, such as type 2 diabetes and high blood pressure. Heart rate and blood pressure. Body temperature. Skin for abnormal  spots. What immunizations do I need?  Vaccines are usually given at various ages, according to a schedule. Your health care provider will recommend vaccines for you based on your age, medical history, and lifestyle or other factors, such as travel or where you work. What tests do I need? Screening Your health care provider may recommend screening tests for certain conditions. This may include: Lipid and cholesterol levels. Diabetes screening. This is done by checking your blood sugar (glucose) after you have not eaten for a while (fasting). Pelvic exam and Pap test. Hepatitis B test. Hepatitis C test. HIV (human immunodeficiency virus) test. STI (sexually transmitted infection) testing, if you are at risk. Lung cancer screening. Colorectal cancer screening. Mammogram. Talk with your health care provider about when you should start having regular mammograms. This may depend on whether you have a family history of breast cancer. BRCA-related cancer screening. This may be done if you have a family history of breast, ovarian, tubal, or peritoneal cancers. Bone density scan. This is done to screen for osteoporosis. Talk with your health care provider about your test results, treatment options, and if necessary, the need for more tests. Follow these instructions at home: Eating and drinking  Eat a diet that includes fresh fruits and vegetables, whole grains, lean protein, and low-fat dairy products. Take vitamin and mineral supplements as recommended by your health care provider. Do not drink alcohol if: Your health care provider tells you not to drink. You are pregnant, may be pregnant, or are planning to become pregnant. If you drink alcohol: Limit how much you have to 0-1 drink a day. Know how much alcohol is in your  drink. In the [place] one drink equals one 12 oz bottle of beer (355 mL), one 5 oz glass of wine (148 mL), or one 1 oz glass of hard liquor (44 mL). Lifestyle Brush your  teeth every morning and night with fluoride toothpaste. Floss one time each day. Exercise for at least 30 minutes 5 or more days each week. Do not use any products that contain nicotine or tobacco. These products include cigarettes, chewing tobacco, and vaping devices, such as e-cigarettes. If you need help quitting, ask your health care provider. Do not use drugs. If you are sexually active, practice safe sex. Use a condom or other form of protection to prevent STIs. If you do not wish to become pregnant, use a form of birth control. If you plan to become pregnant, see your health care provider for a prepregnancy visit. Take aspirin only as told by your health care provider. Make sure that you understand how much to take and what form to take. Work with your health care provider to find out whether it is safe and beneficial for you to take aspirin daily. Find healthy ways to manage stress, such as: Meditation, yoga, or listening to music. Journaling. Talking to a trusted person. Spending time with friends and family. Minimize exposure to UV radiation to reduce your risk of skin cancer. Safety Always wear your seat belt while driving or riding in a vehicle. Do not drive: If you have been drinking alcohol. Do not ride with someone who has been drinking. When you are tired or distracted. While texting. If you have been using any mind-altering substances or drugs. Wear a helmet and other protective equipment during sports activities. If you have firearms in your house, make sure you follow all gun safety procedures. Seek help if you have been physically or sexually abused. What's next? Visit your health care provider once a year for an annual wellness visit. Ask your health care provider how often you should have your eyes and teeth checked. Stay up to date on all vaccines. This information is not intended to replace advice given to you by your health care provider. Make sure you discuss any  questions you have with your health care provider. Document Revised: 11/05/2020 Document Reviewed: 11/05/2020 Elsevier Patient Education  2024 [employer].
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
## 2023-12-12 ENCOUNTER — Ambulatory Visit: Payer: Self-pay | Admitting: Family Medicine

## 2023-12-12 NOTE — Progress Notes (Signed)
 See mychart note The 10-year ASCVD risk score (Arnett DK, et al., 2019) is: 1.6%   Values used to calculate the score:     Age: 53 years     Clincally relevant sex: Female     Is Non-Hispanic African American: No     Diabetic: No     Tobacco smoker: No     Systolic Blood Pressure: 113 mmHg     Is BP treated: No     HDL Cholesterol: 50.9 mg/dL     Total Cholesterol: 229 mg/dL

## 2023-12-19 ENCOUNTER — Ambulatory Visit

## 2024-01-06 ENCOUNTER — Ambulatory Visit

## 2024-01-06 ENCOUNTER — Ambulatory Visit
Admission: RE | Admit: 2024-01-06 | Discharge: 2024-01-06 | Disposition: A | Discharge status: Home / Self Care | Source: Ambulatory Visit | Attending: Diagnostic Radiology | Admitting: Diagnostic Radiology

## 2024-01-06 ENCOUNTER — Other Ambulatory Visit: Payer: Self-pay | Admitting: Obstetrics and Gynecology

## 2024-01-06 DIAGNOSIS — Z0000 Encounter for general adult medical examination without abnormal findings: Secondary | ICD-10-CM

## 2024-12-12 ENCOUNTER — Encounter: Admitting: Family Medicine
